# Patient Record
Sex: Male | Born: 1990 | Race: Black or African American | Hispanic: No | Marital: Single | Smoking: Never smoker
Health system: Southern US, Community
[De-identification: ages and names within clinical notes are randomized; demographics above are authoritative.]

## PROBLEM LIST (undated history)

## (undated) DIAGNOSIS — R569 Unspecified convulsions: Secondary | ICD-10-CM

## (undated) DIAGNOSIS — Q909 Down syndrome, unspecified: Secondary | ICD-10-CM

## (undated) HISTORY — PX: BACK SURGERY: SHX140

## (undated) HISTORY — PX: REPAIR OF LEFT VENTRICLE LACERATION: SHX6555

## (undated) HISTORY — DX: Unspecified convulsions: R56.9

---
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
## 2012-11-30 DIAGNOSIS — E669 Obesity, unspecified: Secondary | ICD-10-CM | POA: Diagnosis not present

## 2012-11-30 DIAGNOSIS — R7989 Other specified abnormal findings of blood chemistry: Secondary | ICD-10-CM | POA: Diagnosis not present

## 2012-11-30 DIAGNOSIS — Q909 Down syndrome, unspecified: Secondary | ICD-10-CM | POA: Diagnosis not present

## 2013-12-11 DIAGNOSIS — A09 Infectious gastroenteritis and colitis, unspecified: Secondary | ICD-10-CM | POA: Diagnosis not present

## 2013-12-11 DIAGNOSIS — H6122 Impacted cerumen, left ear: Secondary | ICD-10-CM | POA: Diagnosis not present

## 2014-09-13 DIAGNOSIS — H6122 Impacted cerumen, left ear: Secondary | ICD-10-CM | POA: Diagnosis not present

## 2015-01-05 ENCOUNTER — Emergency Department: Admission: EM | Admit: 2015-01-05 | Payer: Self-pay | Source: Home / Self Care

## 2015-01-05 ENCOUNTER — Encounter: Payer: Self-pay | Admitting: Emergency Medicine

## 2015-01-05 ENCOUNTER — Emergency Department
Admission: EM | Admit: 2015-01-05 | Discharge: 2015-01-05 | Disposition: A | Discharge status: Home / Self Care | Payer: Medicare Other | Attending: Emergency Medicine | Admitting: Emergency Medicine

## 2015-01-05 ENCOUNTER — Emergency Department: Payer: Medicare Other

## 2015-01-05 DIAGNOSIS — R001 Bradycardia, unspecified: Secondary | ICD-10-CM | POA: Diagnosis not present

## 2015-01-05 DIAGNOSIS — Y998 Other external cause status: Secondary | ICD-10-CM | POA: Insufficient documentation

## 2015-01-05 DIAGNOSIS — Y92002 Bathroom of unspecified non-institutional (private) residence single-family (private) house as the place of occurrence of the external cause: Secondary | ICD-10-CM | POA: Insufficient documentation

## 2015-01-05 DIAGNOSIS — W1839XA Other fall on same level, initial encounter: Secondary | ICD-10-CM | POA: Insufficient documentation

## 2015-01-05 DIAGNOSIS — R4182 Altered mental status, unspecified: Secondary | ICD-10-CM | POA: Diagnosis not present

## 2015-01-05 DIAGNOSIS — S0990XA Unspecified injury of head, initial encounter: Secondary | ICD-10-CM | POA: Insufficient documentation

## 2015-01-05 DIAGNOSIS — R569 Unspecified convulsions: Secondary | ICD-10-CM | POA: Insufficient documentation

## 2015-01-05 DIAGNOSIS — Y9389 Activity, other specified: Secondary | ICD-10-CM | POA: Diagnosis not present

## 2015-01-05 DIAGNOSIS — Z008 Encounter for other general examination: Secondary | ICD-10-CM | POA: Diagnosis present

## 2015-01-05 HISTORY — DX: Down syndrome, unspecified: Q90.9

## 2015-01-05 LAB — CBC WITH DIFFERENTIAL/PLATELET
Basophils Absolute: 0 10*3/uL (ref 0–0.1)
Basophils Relative: 1 %
EOS ABS: 0.1 10*3/uL (ref 0–0.7)
EOS PCT: 2 %
HEMATOCRIT: 52.4 % — AB (ref 40.0–52.0)
HEMOGLOBIN: 17.7 g/dL (ref 13.0–18.0)
LYMPHS ABS: 0.9 10*3/uL — AB (ref 1.0–3.6)
Lymphocytes Relative: 26 %
MCH: 34 pg (ref 26.0–34.0)
MCHC: 33.7 g/dL (ref 32.0–36.0)
MCV: 100.7 fL — ABNORMAL HIGH (ref 80.0–100.0)
MONOS PCT: 11 %
Monocytes Absolute: 0.4 10*3/uL (ref 0.2–1.0)
Neutro Abs: 2.1 10*3/uL (ref 1.4–6.5)
Neutrophils Relative %: 60 %
Platelets: 144 10*3/uL — ABNORMAL LOW (ref 150–440)
RBC: 5.2 MIL/uL (ref 4.40–5.90)
RDW: 14.3 % (ref 11.5–14.5)
WBC: 3.5 10*3/uL — AB (ref 3.8–10.6)

## 2015-01-05 LAB — BASIC METABOLIC PANEL
Anion gap: 6 (ref 5–15)
BUN: 9 mg/dL (ref 6–20)
CHLORIDE: 102 mmol/L (ref 101–111)
CO2: 29 mmol/L (ref 22–32)
CREATININE: 1.19 mg/dL (ref 0.61–1.24)
Calcium: 9 mg/dL (ref 8.9–10.3)
GFR calc Af Amer: >60 mL/min (ref >60)
GFR calc non Af Amer: >60 mL/min (ref >60)
Glucose, Bld: 89 mg/dL (ref 65–99)
Potassium: 4.7 mmol/L (ref 3.5–5.1)
SODIUM: 137 mmol/L (ref 135–145)

## 2015-01-05 MED ORDER — LEVETIRACETAM 750 MG PO TABS
750.0000 mg | ORAL_TABLET | Freq: Once | ORAL | Status: AC
  Administered 2015-01-05: 750 mg via ORAL
  Filled 2015-01-05: qty 1

## 2015-01-05 MED ORDER — LEVETIRACETAM 750 MG PO TABS: 750.0000 mg | ORAL_TABLET | Freq: Two times a day (BID) | ORAL | Status: DC

## 2015-01-05 NOTE — ED Provider Notes (Signed)
[facility] Emergency Department Provider Note   ____________________________________________  Time seen: 11:25 AM I have reviewed the triage vital signs and the triage nursing note.  HISTORY  Chief Complaint Medical Clearance   Historian Patient's mom who is a nurse  HPI Jeffrey Jones is a 24 y.o. male with a history of Down syndrome who is cared for by his mom, was witnessed to have a seizure today. He was standing in front of her in the bathroom and developed a blank stare and then stared off to the side and slumped to the ground and started shaking both arms and legs. Entire altered mental status lasted about 2 minutes and extinguished on its own.Mother states that the patient's father had described a somewhat similar episode twice over the past approximately 10 months to her, but now that she is seen as she does not feel like it was simple syncope, but is actually a seizure. There is no tongue biting or urinary incontinence.    Past Medical History  Diagnosis Date  . Down's syndrome     There are no active problems to display for this patient.   Past Surgical History  Procedure Laterality Date  . Back surgery     open-heart surgery as an infant/child Some sort of brain ventricular surgery as a child, no problems since then c1/C2 fusion.  Current Outpatient Rx  Name  Route  Sig  Dispense  Refill  . levETIRAcetam (KEPPRA) 750 MG tablet   Oral   Take 1 tablet (750 mg total) by mouth 2 (two) times daily.   60 tablet   0    none  Allergies Review of patient's allergies indicates no known allergies.  History reviewed. No pertinent family history.  Social History Social History  Substance Use Topics  . Smoking status: Never Smoker   . Smokeless tobacco: None  . Alcohol Use: No    Review of Systems  Constitutional: Negative for fever. Eyes: Negative for visual changes. ENT: Negative for sore throat. Cardiovascular: Negative for  chest pain. Respiratory: Negative for shortness of breath. Gastrointestinal: Negative for abdominal pain, vomiting and diarrhea. Genitourinary: Negative for dysuria. Musculoskeletal: Negative for back pain. Skin: Negative for rash. Neurological: Mom states patient complained of a headache immediately after event. 10 point Review of Systems otherwise negative ____________________________________________   PHYSICAL EXAM:  VITAL SIGNS: ED Triage Vitals  Enc Vitals Group     BP 01/05/15 1057 132/74 mmHg     Pulse Rate 01/05/15 1057 74     Resp 01/05/15 1057 18     Temp 01/05/15 1057 98 F (36.7 C)     Temp Source 01/05/15 1057 Oral     SpO2 01/05/15 1057 100 %     Weight --      Height --      Head Cir --      Peak Flow --      Pain Score 01/05/15 1057 0     Pain Loc --      Pain Edu? --      Excl. in GC? --      Constitutional: Alert and cooperative. Well appearing and in no distress. Eyes: Right lateral subconjunctival hemorrhage. PERRL. Normal extraocular movements. ENT   Head: Normocephalic and atraumatic.   Nose: No congestion/rhinnorhea.   Mouth/Throat: Mucous membranes are moist. Macroglossia. No tongue injury.   Neck: No stridor. Cardiovascular/Chest: Bradycardic, regular..  No murmurs, rubs, or gallops. Respiratory: Normal respiratory effort without tachypnea nor retractions.  Breath sounds are clear and equal bilaterally. No wheezes/rales/rhonchi. Gastrointestinal: Soft. No distention, no guarding, no rebound. Nontender   Genitourinary/rectal:Deferred Musculoskeletal: Nontender with normal range of motion in all extremities. No joint effusions.  No lower extremity tenderness.  No edema. Neurologic:  No gross or focal neurologic deficits are appreciated. Skin:  Skin is warm, dry and intact. No rash noted.   ____________________________________________   EKG I, Governor Rooks, MD, the attending physician have personally viewed and interpreted all  ECGs.  No EKG performed ____________________________________________  LABS (pertinent positives/negatives)  Basic metabolic panel within normal limits White blood cell count 3.5, hemoglobin 17.7 and platelet count 144  ____________________________________________  RADIOLOGY All Xrays were viewed by me. Imaging interpreted by Radiologist.  CT head noncontrast: No acute intracranial abnormalities. __________________________________________  PROCEDURES  Procedure(s) performed: None  Critical Care performed: None  ____________________________________________   ED COURSE / ASSESSMENT AND PLAN  CONSULTATIONS: Phone consultation with neurologist  Pertinent labs & imaging results that were available during my care of the patient were reviewed by me and considered in my medical decision making (see chart for details).  Although initially the mom's description of the symptoms sounded like it could be syncope versus seizure, mom is a nurse and feels like this was definitely seizure activity. He has not been evaluated for this, and as he has had a ventricular brain procedure in the remote past, I discussed CT scan for evaluation today, and we chose to proceed. As this may actually be a third seizure, I discussed the case with the neurologist on-call Dr.Zeylikman, who recommended starting Keppra 750 mg twice daily.  Patient / Family / Caregiver informed of clinical course, medical decision-making process, and agree with plan.   I discussed return precautions, follow-up instructions, and discharged instructions with patient and/or family.  ___________________________________________   FINAL CLINICAL IMPRESSION(S) / ED DIAGNOSES   Final diagnoses:  Seizure (HCC)       Governor Rooks, MD 01/05/15 1353

## 2015-01-05 NOTE — Discharge Instructions (Signed)
Patient was evaluated for seizure, and started on antiseizure medication called Keppra. Return to the emergency room for any worsening condition including seizure that lasts longer than 5 or 10 minutes, or any trouble breathing. Return for any confusion or altered mental status, fever, weakness, numbness, or any other symptoms concerning to you.  We discussed your primary care physician may order additional studies such as MRI of the brain and EEG. You're also recommended follow-up with a neurologist, call on Monday to make appointment for next available   Seizure, Adult A seizure is abnormal electrical activity in the brain. Seizures usually last from 30 seconds to 2 minutes. There are various types of seizures. Before a seizure, you may have a warning sensation (aura) that a seizure is about to occur. An aura may include the following symptoms:   Fear or anxiety.  Nausea.  Feeling like the room is spinning (vertigo).  Vision changes, such as seeing flashing lights or spots. Common symptoms during a seizure include:  A change in attention or behavior (altered mental status).  Convulsions with rhythmic jerking movements.  Drooling.  Rapid eye movements.  Grunting.  Loss of bladder and bowel control.  Bitter taste in the mouth.  Tongue biting. After a seizure, you may feel confused and sleepy. You may also have an injury resulting from convulsions during the seizure. HOME CARE INSTRUCTIONS   If you are given medicines, take them exactly as prescribed by your health care provider.  Keep all follow-up appointments as directed by your health care provider.  Do not swim or drive or engage in risky activity during which a seizure could cause further injury to you or others until your health care provider says it is OK.  Get adequate rest.  Teach friends and family what to do if you have a seizure. They should:  Lay you on the ground to prevent a fall.  Put a cushion under  your head.  Loosen any tight clothing around your neck.  Turn you on your side. If vomiting occurs, this helps keep your airway clear.  Stay with you until you recover.  Know whether or not you need emergency care. SEEK IMMEDIATE MEDICAL CARE IF:  The seizure lasts longer than 5 minutes.  The seizure is severe or you do not wake up immediately after the seizure.  You have an altered mental status after the seizure.  You are having more frequent or worsening seizures. Someone should drive you to the emergency department or call local emergency services (911 in [place] MAKE SURE YOU:  Understand these instructions.  Will watch your condition.  Will get help right away if you are not doing well or get worse.   This information is not intended to replace advice given to you by your health care provider. Make sure you discuss any questions you have with your health care provider.   Document Released: 02/13/2000 Document Revised: 03/08/2014 Document Reviewed: 09/27/2012 Elsevier Interactive Patient Education [employer]2016 Elsevier Inc.

## 2015-01-05 NOTE — ED Notes (Addendum)
24 yom PMHx Down's syndrome presents from home via EMS. Per EMS, mom called 911 after pt had a 'blank stare' lasting approx 45 seconds with slight tremor of the arms. Pt then fell back, lowering to the ground before behavior returning to normal. Previous hx of same ~ 1 year ago but was not worked up for seizure activity. Now pt at baseline. Glucose 93.

## 2015-01-06 ENCOUNTER — Other Ambulatory Visit: Payer: Self-pay

## 2015-01-06 ENCOUNTER — Other Ambulatory Visit: Payer: Self-pay | Admitting: Internal Medicine

## 2015-01-06 DIAGNOSIS — R569 Unspecified convulsions: Secondary | ICD-10-CM

## 2015-01-08 ENCOUNTER — Ambulatory Visit
Admission: RE | Admit: 2015-01-08 | Discharge: 2015-01-08 | Disposition: A | Discharge status: Home / Self Care | Payer: Medicare Other | Source: Ambulatory Visit | Attending: Internal Medicine | Admitting: Internal Medicine

## 2015-01-08 DIAGNOSIS — R569 Unspecified convulsions: Secondary | ICD-10-CM | POA: Diagnosis not present

## 2015-01-16 DIAGNOSIS — R404 Transient alteration of awareness: Secondary | ICD-10-CM | POA: Diagnosis not present

## 2015-01-16 DIAGNOSIS — R4689 Other symptoms and signs involving appearance and behavior: Secondary | ICD-10-CM | POA: Diagnosis not present

## 2015-01-16 DIAGNOSIS — Q909 Down syndrome, unspecified: Secondary | ICD-10-CM | POA: Diagnosis not present

## 2015-01-20 ENCOUNTER — Encounter: Payer: Self-pay | Admitting: Neurology

## 2015-01-20 ENCOUNTER — Ambulatory Visit (INDEPENDENT_AMBULATORY_CARE_PROVIDER_SITE_OTHER): Payer: Medicare Other | Admitting: Neurology

## 2015-01-20 VITALS — BP 98/46 | HR 67 | Ht 63.0 in | Wt 180.5 lb

## 2015-01-20 DIAGNOSIS — R569 Unspecified convulsions: Secondary | ICD-10-CM | POA: Insufficient documentation

## 2015-01-20 DIAGNOSIS — R55 Syncope and collapse: Secondary | ICD-10-CM | POA: Diagnosis not present

## 2015-01-20 NOTE — Progress Notes (Signed)
Reason for visit: Possible seizure  Referring physician: Dr. Synetta Jeffrey Jones is a 24 y.o. male  History of present illness:  Jeffrey Jones is a 24 year old right-handed black male with a history of Down's syndrome with significant cognitive issues. The patient has had 3 blackout events that began in December 2015, another occurred in July 2016, and a recent event occurred on 01/05/2015. The patient had similar events with all 3 episodes. The last one that was witnessed by his mother was associated with a blank stare, the patient then slid to the ground up against the wall, he felt diaphoretic, he had a few myoclonic jerks, and was unconscious for about 2 minutes. There was no tongue biting, or loss of control of the bowels or the bladder. The mother felt the pulse, the pulse was quite weak until after he seemed to recover consciousness when the pulse became strong again. The patient does have a history of a ventricular repair procedure as an infant. There have been no episodes of cardiac rhythm abnormalities in the past. The mother indicates that over the last year or so he has had some cognitive decline that has been slowly progressive. The patient has undergone a CT scan of the head and MRI evaluation of the brain that was unremarkable. An EEG study was done, the results of the study are not available to me. This was done at the [facility]. The patient is sent to this office for further evaluation.  Past Medical History  Diagnosis Date  . Down's syndrome   . Seizures [facility])     Past Surgical History  Procedure Laterality Date  . Back surgery    . Repair of left ventricle laceration      Family History  Problem Relation Age of Onset  . Diabetes Father   . Bell's palsy Father   . Seizures Neg Hx     Social history:  reports that he has never smoked. He has never used smokeless tobacco. He reports that he does not drink alcohol or use illicit drugs.  Medications:  Prior  to Admission medications   Medication Sig Start Date End Date Taking? Authorizing Lerin Jech  levETIRAcetam (KEPPRA) 750 MG tablet Take 1 tablet (750 mg total) by mouth 2 (two) times daily. 01/05/15  Yes Governor Rooks, MD      Allergies  Allergen Reactions  . Amoxicillin     ROS:  Out of a complete 14 system review of symptoms, the patient complains only of the following symptoms, and all other reviewed systems are negative.  Hearing loss Snoring, sleep apnea Confusion, passing out  Blood pressure 98/46, pulse 67, height  (1.6 m), weight 180 lb 8 oz (81.874 kg).  Physical Exam  General: The patient is alert and cooperative at the time of the examination.  Eyes: Pupils are equal, round, and reactive to light. Discs are flat bilaterally.  Neck: The neck is supple, no carotid bruits are noted.  Respiratory: The respiratory examination is clear.  Cardiovascular: The cardiovascular examination reveals a regular rate and rhythm, no obvious murmurs or rubs are noted.  Skin: Extremities are without significant edema.  Neurologic Exam  Mental status: The patient is alert and oriented x 3 at the time of the examination. The patient has apparent normal recent and remote memory, with an apparently normal attention span and concentration ability.  Cranial nerves: Facial symmetry is present. There is good sensation of the face to pinprick and soft touch bilaterally.  The strength of the facial muscles and the muscles to head turning and shoulder shrug are normal bilaterally. Speech is limited, slightly dysarthric. Extraocular movements are full. Visual fields are full. The tongue is midline, and the patient has symmetric elevation of the soft palate. No obvious hearing deficits are noted.  Motor: The motor testing reveals 5 over 5 strength of all 4 extremities. Good symmetric motor tone is noted throughout.  Sensory: Sensory testing is intact to pinprick, soft touch and vibration  sensation on all 4 extremities. No evidence of extinction is noted.  Coordination: Cerebellar testing reveals good finger-nose-finger and heel-to-shin bilaterally.  Gait and station: Gait is normal. Tandem gait was not tested. Romberg is negative. No drift is seen.  Reflexes: Deep tendon reflexes are symmetric and normal bilaterally. Toes are downgoing bilaterally.   MRI brain 01/08/15:  IMPRESSION: 1. No acute or focal lesion to explain seizures. Normal MRI of the brain. 2. Metallic artifact from posterior fusion at C1-2.  * MRI scan images were reviewed online. I agree with the written report.    Assessment/Plan:  1. Down syndrome  2. Syncope versus seizure  The episode described by the mother is unclear, this may have been a vasovagal syncopal event, possibly a seizure. The history of Down's syndrome does put him at a slightly higher risk for seizures. The patient also has a history of cardiac disease, with prior cardiac surgery. The patient will be sent for a cardiac monitor study. The mother indicated that the pulse was quite weak at the time of the syncope. The patient was diaphoretic. I will try to get the report of the EEG study done. The patient will follow-up in 4 months. He does not operate a motor vehicle or engage in any work activity.  Marlan Palau. Keith Willis MD 01/20/2015 7:31 PM  [facility] [address] [place], [place] [id number]  Phone [phone] Fax [phone]

## 2015-01-20 NOTE — Patient Instructions (Signed)

## 2015-01-22 ENCOUNTER — Telehealth: Payer: Self-pay

## 2015-01-22 NOTE — Telephone Encounter (Signed)
Results received and placed on Dr. Anne HahnWillis' desk.

## 2015-01-22 NOTE — Telephone Encounter (Signed)
I called the [facility] and spoke to [place]. She stated the report is not completed yet. They will fax when it is complete.

## 2015-01-27 ENCOUNTER — Telehealth: Payer: Self-pay | Admitting: Neurology

## 2015-01-27 NOTE — Telephone Encounter (Signed)
EEG study done on 16 January 2015 was unremarkable in the awake and drowsy states.

## 2015-02-03 DIAGNOSIS — R569 Unspecified convulsions: Secondary | ICD-10-CM | POA: Diagnosis not present

## 2015-02-03 DIAGNOSIS — Z23 Encounter for immunization: Secondary | ICD-10-CM | POA: Diagnosis not present

## 2015-02-14 ENCOUNTER — Encounter: Payer: Self-pay | Admitting: Neurology

## 2015-05-21 ENCOUNTER — Ambulatory Visit: Payer: Self-pay | Admitting: Neurology

## 2015-11-22 DIAGNOSIS — H6091 Unspecified otitis externa, right ear: Secondary | ICD-10-CM | POA: Diagnosis not present

## 2015-11-22 DIAGNOSIS — H9201 Otalgia, right ear: Secondary | ICD-10-CM | POA: Diagnosis not present

## 2016-06-10 ENCOUNTER — Emergency Department (HOSPITAL_COMMUNITY)
Admission: EM | Admit: 2016-06-10 | Discharge: 2016-06-10 | Disposition: A | Discharge status: Home / Self Care | Payer: Medicare Other | Attending: Emergency Medicine | Admitting: Emergency Medicine

## 2016-06-10 ENCOUNTER — Encounter (HOSPITAL_COMMUNITY): Payer: Self-pay | Admitting: Emergency Medicine

## 2016-06-10 ENCOUNTER — Emergency Department (HOSPITAL_COMMUNITY): Payer: Medicare Other

## 2016-06-10 DIAGNOSIS — R0789 Other chest pain: Secondary | ICD-10-CM | POA: Insufficient documentation

## 2016-06-10 DIAGNOSIS — R079 Chest pain, unspecified: Secondary | ICD-10-CM | POA: Diagnosis not present

## 2016-06-10 DIAGNOSIS — R072 Precordial pain: Secondary | ICD-10-CM | POA: Diagnosis present

## 2016-06-10 LAB — CBC
HEMATOCRIT: 50.2 % (ref 39.0–52.0)
HEMOGLOBIN: 16.9 g/dL (ref 13.0–17.0)
MCH: 34 pg (ref 26.0–34.0)
MCHC: 33.7 g/dL (ref 30.0–36.0)
MCV: 101 fL — AB (ref 78.0–100.0)
Platelets: 158 10*3/uL (ref 150–400)
RBC: 4.97 MIL/uL (ref 4.22–5.81)
RDW: 13.7 % (ref 11.5–15.5)
WBC: 2.2 10*3/uL — AB (ref 4.0–10.5)

## 2016-06-10 LAB — BASIC METABOLIC PANEL
ANION GAP: 8 (ref 5–15)
BUN: 12 mg/dL (ref 6–20)
CHLORIDE: 102 mmol/L (ref 101–111)
CO2: 29 mmol/L (ref 22–32)
Calcium: 9 mg/dL (ref 8.9–10.3)
Creatinine, Ser: 1.18 mg/dL (ref 0.61–1.24)
GFR calc Af Amer: >60 mL/min (ref >60)
GFR calc non Af Amer: >60 mL/min (ref >60)
Glucose, Bld: 67 mg/dL (ref 65–99)
POTASSIUM: 4.2 mmol/L (ref 3.5–5.1)
SODIUM: 139 mmol/L (ref 135–145)

## 2016-06-10 LAB — I-STAT TROPONIN, ED
TROPONIN I, POC: 0 ng/mL (ref 0.00–0.08)
Troponin i, poc: 0 ng/mL (ref 0.00–0.08)

## 2016-06-10 MED ORDER — IBUPROFEN 800 MG PO TABS
800.0000 mg | ORAL_TABLET | Freq: Once | ORAL | Status: AC
  Administered 2016-06-10: 800 mg via ORAL
  Filled 2016-06-10: qty 1

## 2016-06-10 MED ORDER — IBUPROFEN 800 MG PO TABS: 800.0000 mg | ORAL_TABLET | Freq: Three times a day (TID) | ORAL | 0 refills | Status: DC

## 2016-06-10 NOTE — Discharge Instructions (Signed)
Take motrin as needed for pain.   See your primary care doctor   Return to ER if you have severe chest pain, shortness of breath, headaches, vomiting, fevers.

## 2016-06-10 NOTE — ED Provider Notes (Signed)
[facility]-EMERGENCY DEPT Provider Note   CSN: [id number] Arrival date & time: 06/10/16  1011  By signing my name below, I, Cynda Acres, attest that this documentation has been prepared under the direction and in the presence of Charlynne Pander, MD. Electronically Signed: Cynda Acres, Scribe. 06/10/16. 12:39 PM.  History   Chief Complaint Chief Complaint  Patient presents with  . Chest Pain  . Headache   LEVEL 5 CAVEAT DUE TO DOWN SYNDROME   HPI Comments: Jeffrey Jones is a 26 y.o. male with a history of down syndrome and seizures, who presents to the Emergency Department complaining of sudden-onset, constant substernal chest pain that began earlier today.  This chest pain is unusual for the patient. Per mother, the patient was on the bus alone this morning when he began complaining of chest pain, EMS was called. Mother reports a syncopal episode one year ago and saw neurology and had nl EEG per mother and had an EKG that was unremarkable. Patient had heart surgery as a child per mother (possible Left ventricular laceration per chart). Patient also complained of a headache. No medications were given. Blood pressure 11/82 and HR 76 while en route. Patient's pain is under control now.   The history is provided by a parent. No language interpreter was used.    Past Medical History:  Diagnosis Date  . Down's syndrome   . Seizures [facility])     Patient Active Problem List   Diagnosis Date Noted  . Seizures (HCC) 01/20/2015    Past Surgical History:  Procedure Laterality Date  . BACK SURGERY    . REPAIR OF LEFT VENTRICLE LACERATION         Home Medications    Prior to Admission medications   Medication Sig Start Date End Date Taking? Authorizing Provider  levETIRAcetam (KEPPRA) 750 MG tablet Take 1 tablet (750 mg total) by mouth 2 (two) times daily. 01/05/15   Governor Rooks, MD    Family History Family History  Problem Relation Age of Onset  . Diabetes Father   . Bell's  palsy Father   . Seizures Neg Hx     Social History Social History  Substance Use Topics  . Smoking status: Never Smoker  . Smokeless tobacco: Never Used  . Alcohol use No     Allergies   Amoxicillin   Review of Systems Review of Systems  Reason unable to perform ROS: Down syndrome.     Physical Exam Updated Vital Signs BP (!) 117/57 (BP Location: Right Arm)   Pulse (!) 58   Temp 98.3 F (36.8 C) (Oral)   Resp 18   SpO2 100%   Physical Exam  Constitutional: He is oriented to person, place, and time. He appears well-developed and well-nourished.  HENT:  Head: Normocephalic and atraumatic.  Eyes: EOM are normal.  Neck: Normal range of motion.  Cardiovascular: Normal rate and regular rhythm.   Pulmonary/Chest: Effort normal and breath sounds normal. No respiratory distress.  Reproducible tenderness of the chest. Previous midline sternotomy scar, no tenderness.   Abdominal: Soft. He exhibits no distension. There is no tenderness.  Musculoskeletal: Normal range of motion.  No pitting edema. No calf tenderness.   Neurological: He is alert and oriented to person, place, and time.  Skin: Skin is warm and dry.  Psychiatric: He has a normal mood and affect.  Nursing note and vitals reviewed.    ED Treatments / Results  DIAGNOSTIC STUDIES: Oxygen Saturation is 100% on RA, normal  by my interpretation.    COORDINATION OF CARE: 12:38 PM Discussed treatment plan with parent at bedside and parent agreed to plan.  Labs (all labs ordered are listed, but only abnormal results are displayed) Labs Reviewed  CBC - Abnormal; Notable for the following:       Result Value   WBC 2.2 (*)    MCV 101.0 (*)    All other components within normal limits  BASIC METABOLIC PANEL  I-STAT TROPOININ, ED  I-STAT TROPOININ, ED    EKG  EKG Interpretation  Date/Time:  Thursday June 10 2016 10:16:27 EDT Ventricular Rate:  62 PR Interval:  422 QRS Duration: 100 QT  Interval:  384 QTC Calculation: 389 R Axis:   63 Text Interpretation:  Sinus rhythm with sinus arrhythmia with 1st degree A-V block Otherwise normal ECG No significant change since last tracing Confirmed by Conception Doebler  MD, Akram Kissick ([id number]) on 06/10/2016 12:33:48 PM       Radiology Dg Chest 2 View  Result Date: 06/10/2016 CLINICAL DATA:  Intermittent chest pain. EXAM: CHEST  2 VIEW COMPARISON:  04/08/2008 FINDINGS: Cardiomegaly, mild. Postoperative mediastinum, with chronic increased density over the anterior heart in the lateral projection. There is no edema, consolidation, effusion, or pneumothorax. IMPRESSION: Mild cardiomegaly.  No evidence of consolidation or edema. Electronically Signed   By: Marnee Spring M.D.   On: 06/10/2016 11:10    Procedures Procedures (including critical care time)  Medications Ordered in ED Medications  ibuprofen (ADVIL,MOTRIN) tablet 800 mg (800 mg Oral Given 06/10/16 1247)     Initial Impression / Assessment and Plan / ED Course  I have reviewed the triage vital signs and the nursing notes.  Pertinent labs & imaging results that were available during my care of the patient were reviewed by me and considered in my medical decision making (see chart for details).     Jeffrey Jones is a 26 y.o. male here with chest pain. Pain is reproducible. No recent travel, not tachycardic. Had heart surgery as a child but no stents in the heart. EKG unremarkable. Delta trop neg. CXR nl. Likely muscle strain. Recommend motrin prn pain.    Final Clinical Impressions(s) / ED Diagnoses   Final diagnoses:  None    New Prescriptions New Prescriptions   No medications on file   I personally performed the services described in this documentation, which was scribed in my presence. The recorded information has been reviewed and is accurate.    Charlynne Pander, MD 06/10/16 1255

## 2016-06-10 NOTE — ED Triage Notes (Signed)
Pt was on scat bus this morning- originally complaining of CP. EMS reports intermittent CP. Pt then complaining of headache. EMS states pt reports no HA and CP off and on throughout their care for him. Pt has down syndrome. No meds given. BP 118/82, HR 76,

## 2016-07-05 DIAGNOSIS — G479 Sleep disorder, unspecified: Secondary | ICD-10-CM | POA: Diagnosis not present

## 2016-07-05 DIAGNOSIS — J302 Other seasonal allergic rhinitis: Secondary | ICD-10-CM | POA: Diagnosis not present

## 2016-08-18 ENCOUNTER — Encounter: Payer: Self-pay | Admitting: Neurology

## 2016-08-18 ENCOUNTER — Ambulatory Visit (INDEPENDENT_AMBULATORY_CARE_PROVIDER_SITE_OTHER): Payer: Medicare Other | Admitting: Neurology

## 2016-08-18 VITALS — BP 110/60 | HR 64 | Ht 63.0 in | Wt 199.0 lb

## 2016-08-18 DIAGNOSIS — G4733 Obstructive sleep apnea (adult) (pediatric): Secondary | ICD-10-CM | POA: Diagnosis not present

## 2016-08-18 NOTE — Progress Notes (Signed)
Reason for visit: Suspected sleep apnea  Jeffrey Jones is an 26 y.o. male  History of present illness:  Jeffrey Jones is a 26 year old right-handed black male with a history of Down's syndrome. The patient returns to the office today, he was last seen in 2016 for a syncope versus seizure event. The patient has not had any further such events. The mother comes in with him today indicating that she has noted over the last several months that he has sleep apnea. She has observed him during sleep with severe snoring, obstruction of the airway and apneic episodes. The patient does have excessive daytime drowsiness, he has had some decline in cognitive functioning. He does report early morning headaches at times. The mother wishes to get a full evaluation for the sleep apnea.   Past Medical History:  Diagnosis Date  . Down's syndrome   . Seizures (HCC)     Past Surgical History:  Procedure Laterality Date  . BACK SURGERY    . REPAIR OF LEFT VENTRICLE LACERATION      Family History  Problem Relation Age of Onset  . Diabetes Father   . Bell's palsy Father   . Seizures Neg Hx     Social history:  reports that he has never smoked. He has never used smokeless tobacco. He reports that he does not drink alcohol or use drugs.    Allergies  Allergen Reactions  . Amoxicillin     Medications:  Prior to Admission medications   Medication Sig Start Date End Date Taking? Authorizing Provider  fexofenadine (ALLEGRA) 180 MG tablet Take 180 mg by mouth daily.   Yes [provider]  ibuprofen (ADVIL,MOTRIN) 200 MG tablet Take 200 mg by mouth every 6 (six) hours as needed.   Yes [provider]  Multiple Vitamin (MULTIVITAMIN) tablet Take 1 tablet by mouth daily.   Yes [provider]    ROS:  Out of a complete 14 system review of symptoms, the patient complains only of the following symptoms, and all other reviewed systems are negative.  Chest pain Sleep apnea,  excessive daytime drowsiness, snoring  Blood pressure 110/60, pulse 64, height 5\' 3"  (1.6 m), weight 199 lb (90.3 kg), SpO2 98 %.  Physical Exam  General: The patient is alert and cooperative at the time of the examination. The patient is minimally verbal.  Skin: No significant peripheral edema is noted.   Neurologic Exam  Mental status: The patient is alert and cooperative at the time of examination. The patient will follow verbal commands, he is minimally verbal.   Cranial nerves: Facial symmetry is present. Speech is normal, no aphasia or dysarthria is noted. Extraocular movements are full. Visual fields are full.  Motor: The patient has good strength in all 4 extremities.  Sensory examination: Soft touch sensation is symmetric on the face, arms, and legs.  Coordination: The patient has good finger-nose-finger and heel-to-shin bilaterally. Some apraxia with the use of the extremities is noted.  Gait and station: The patient has a normal gait. Tandem gait is normal. Romberg is negative. No drift is seen.  Reflexes: Deep tendon reflexes are symmetric.   Assessment/Plan:  1. Down syndrome  2. Observed sleep apnea  The patient will be referred for sleep evaluation for possible sleep apnea. He will follow-up with me on an as-needed basis.  Marlan Palau. Keith Arnoldo Hildreth MD 08/18/2016 3:32 PM  [facility] [address] [place], [place] [id number]  Phone [phone] Fax [phone]

## 2016-08-18 NOTE — Patient Instructions (Signed)
   We will get an evaluation for sleep apnea set up.

## 2016-08-25 ENCOUNTER — Ambulatory Visit (INDEPENDENT_AMBULATORY_CARE_PROVIDER_SITE_OTHER): Payer: Medicare Other | Admitting: Neurology

## 2016-08-25 ENCOUNTER — Encounter: Payer: Self-pay | Admitting: Neurology

## 2016-08-25 VITALS — BP 103/58 | HR 79 | Ht 63.0 in | Wt 199.0 lb

## 2016-08-25 DIAGNOSIS — Q382 Macroglossia: Secondary | ICD-10-CM | POA: Diagnosis not present

## 2016-08-25 DIAGNOSIS — R29898 Other symptoms and signs involving the musculoskeletal system: Secondary | ICD-10-CM | POA: Insufficient documentation

## 2016-08-25 DIAGNOSIS — Q909 Down syndrome, unspecified: Secondary | ICD-10-CM | POA: Insufficient documentation

## 2016-08-25 DIAGNOSIS — Z8639 Personal history of other endocrine, nutritional and metabolic disease: Secondary | ICD-10-CM | POA: Insufficient documentation

## 2016-08-25 DIAGNOSIS — M6289 Other specified disorders of muscle: Secondary | ICD-10-CM | POA: Diagnosis not present

## 2016-08-25 DIAGNOSIS — G4719 Other hypersomnia: Secondary | ICD-10-CM | POA: Diagnosis not present

## 2016-08-25 DIAGNOSIS — R0683 Snoring: Secondary | ICD-10-CM | POA: Diagnosis not present

## 2016-08-25 DIAGNOSIS — Z87898 Personal history of other specified conditions: Secondary | ICD-10-CM | POA: Insufficient documentation

## 2016-08-25 NOTE — Patient Instructions (Signed)

## 2016-08-25 NOTE — Progress Notes (Signed)
SLEEP MEDICINE CLINIC   Provider:  Melvyn Novas, M D  Primary Care Physician:  Renford Dills, MD   Referring Provider: Renford Dills, MD    Chief Complaint  Patient presents with  . Sleep Consult    He is here with his his father, Jeffrey Jones.  Reports snoring, morning headaches and excessive daytime drowsiness.    HPI:  Jeffrey Jones is a 26 y.o. male , seen here as in a referral/ revisit  from Dr. Anne Hahn for a sleep evaluation.   Jeffrey Jones is a 26 year old right-handed African-American gentleman with Down syndrome seen here today accompanied by his father. Jeffrey Jones does not carry a conversation with me, his sleep concerns have been raised because the patient is a loud snorer, has morning headaches and excessive daytime drowsiness. 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 This will be his first evaluation for sleep at age 15. He had 2 cardiac surgeries, had the repair of the left ventricle laceration. He is currently taking ibuprofen multivitamins and Allegra for allergic rhinitis.  Chief complaint according to patient : per father " he is sleepy as soon as he leaves the bed"   Sleep habits are as follows: The patient goes to bed between 8:30 and 9 PM and is usually asleep promptly. He prefers a lateral sleep position. He sleeps with only one pillow. He will have one or 2 bathroom breaks at night but not every night. He seems to dream frequently and actively participate in his dreams expressing this with motor function and yelling - REM BD ?  The patient cannot sleep in supine position without getting short of breath. Sometimes he needs assistance to turn back to the side. He rises in the morning slowly after 7 or sometimes 8 hours of sleep.  Sleep medical history and family sleep history:  There is no known family history of obstructive sleep apnea, there  is no history of sleepwalking in the patient.   Social history: He does not drink coffee, tea and no soda. He attends [employer], 4 days a week in daytime. He works at a [employer] one day a week.    Reason for visit: Suspected sleep apnea  Jeffrey Jones is an 26 y.o. male  History of present illness:  Mr. [name] is a 26 year old right-handed black male with a history of Down's syndrome. The patient returns to the office today, he was last seen in 2016 for a syncope versus seizure event. The patient has not had any further such events. The mother comes in with him today indicating that she has noted over the last several months that he has sleep apnea. She has observed him during sleep with severe snoring, obstruction of the airway and apneic episodes. The patient does have excessive daytime drowsiness, he has had some decline in cognitive functioning. He does report early morning headaches at times. The mother wishes to get a full evaluation for the sleep apnea.   Past Medical History:  Diagnosis Date  . Down's syndrome   . Seizures (HCC)     Review of Systems: Out of a complete 14 system review, the patient complains of only the following symptoms, and all other reviewed systems are negative. Snoring , EDS , not driving. Cognitive and mental development is reduced.   Epworth score 19 , Fatigue severity score  N/a  Social History   Social History  . Marital status: Single    Spouse name: N/A  . Number of children: 0  . Years of education: HS   Occupational History  . N/A    Social History Main Topics  . Smoking status: Never Smoker  . Smokeless tobacco: Never Used  . Alcohol use No  . Drug use: No  . Sexual activity: Not on file   Other Topics Concern  . Not on file   Social History Narrative   Patient occasionally drinks soft drinks.   Patient is right handed but does other tasks with his left hand.           Family History  Problem Relation Age of  Onset  . Diabetes Father   . Bell's palsy Father   . Seizures Neg Hx     Past Medical History:  Diagnosis Date  . Down's syndrome   . Seizures (HCC)     Past Surgical History:  Procedure Laterality Date  . BACK SURGERY    . REPAIR OF LEFT VENTRICLE LACERATION      Current Outpatient Prescriptions  Medication Sig Dispense Refill  . fexofenadine (ALLEGRA) 180 MG tablet Take 180 mg by mouth daily.    Marland Kitchen. ibuprofen (ADVIL,MOTRIN) 200 MG tablet Take 200 mg by mouth every 6 (six) hours as needed.    . Multiple Vitamin (MULTIVITAMIN) tablet Take 1 tablet by mouth daily.     No current facility-administered medications for this visit.     Allergies as of 08/25/2016 - Review Complete 08/25/2016  Allergen Reaction Noted  . Amoxicillin  01/17/2015    Vitals: BP (!) 103/58   Pulse 79   Ht 5\' 3"  (1.6 m)   Wt 199 lb (90.3 kg)   BMI 35.25 kg/m  Last Weight:  Wt Readings from Last 1 Encounters:  08/25/16 199 lb (90.3 kg)   ZOX:WRUEBMI:Body mass index is 35.25 kg/m.     Last Height:   Ht Readings from Last 1 Encounters:  08/25/16 5\' 3"  (1.6 m)    Physical exam:  General: The patient is awake, alert and appears not in acute distress. The patient is well groomed. Head: Normocephalic, atraumatic. Neck is supple. Mallampati  5, macroglossia noted.  neck circumference: 18. Nasal airflow reduced,  Retrognathia is seen.  Cardiovascular:  Regular rate and rhythm , without  murmurs or carotid bruit, and without distended neck veins. Respiratory: Lungs are clear to auscultation. Skin:  Without evidence of edema, or rash Trunk: BMI is 35. The patient's posture is hunched.  Neurologic exam : The patient is awake and alert,  Cranial nerves: Pupils are equal and briskly reactive to light. Funduscopic exam without evidence of pallor or edema. Extraocular movements  in vertical and horizontal planes intact and without nystagmus. Visual fields by finger perimetry are intact. Hearing to finger rub  intact.   Facial sensation intact to fine touch.  Facial motor strength is symmetric and tongue and uvula move midline. Shoulder shrug was symmetrical.   Motor exam: reduced tone, reduced muscle bulk and symmetrically reduced  strength in all extremities.  Sensory:  Not tested.  Gait and station: Patient walks without assistive device . DTR 2 plus.   Assessment:  After physical and neurologic examination, review of laboratory studies,  Personal review of imaging studies, reports of other /same  Imaging studies, results of polysomnography and / or neurophysiology testing and pre-existing records as far as provided in visit., my assessment is  1)  I agree with Dr. Anne Hahn that Elissa Lovett has a high risk of having obstructive sleep apnea based on an lower than average muscle tone, macroglossia, retrognathia and a very high-grade Mallampati. His neck circumference is 18 inches and also pauses and additional risk as does the elevated body mass index. Most of these risk factors are not modifiable and are related to the patient's Down syndrome. I will order an attended sleep study split night polysomnography. We will take a special precaution and allowing apparent to sleep in the sleep lab by the patient is evaluated. I think this is essential to make him comfortable.     The patient was advised of the nature of the diagnosed disorder , the treatment options and the  risks for general health and wellness arising from not treating the condition.   I spent more than 45 minutes of face to face time with the patient.  Greater than 50% of time was spent in counseling and coordination of care. We have discussed the diagnosis and differential and I answered the patient's questions.    Plan:  Treatment plan and additional workup : Split night PSG - please order capnography and watch out for hypoxia.  No Follow-up on file. Dr Anne Hahn will follow with general neurology. I will continue the sleep care for  this pleasant patient.    Melvyn Novas, MD 08/25/2016, 12:10 PM  Certified in Neurology by ABPN Certified in Sleep Medicine by [facility] [address] [place], [place] [address]

## 2016-09-05 DIAGNOSIS — G4733 Obstructive sleep apnea (adult) (pediatric): Secondary | ICD-10-CM | POA: Diagnosis not present

## 2017-01-06 DIAGNOSIS — G4733 Obstructive sleep apnea (adult) (pediatric): Secondary | ICD-10-CM | POA: Diagnosis not present

## 2017-03-29 DIAGNOSIS — G4733 Obstructive sleep apnea (adult) (pediatric): Secondary | ICD-10-CM | POA: Diagnosis not present

## 2017-03-29 DIAGNOSIS — Q909 Down syndrome, unspecified: Secondary | ICD-10-CM | POA: Diagnosis not present

## 2017-04-07 DIAGNOSIS — G4733 Obstructive sleep apnea (adult) (pediatric): Secondary | ICD-10-CM | POA: Diagnosis not present

## 2017-05-02 ENCOUNTER — Other Ambulatory Visit (HOSPITAL_BASED_OUTPATIENT_CLINIC_OR_DEPARTMENT_OTHER): Payer: Self-pay

## 2017-05-02 DIAGNOSIS — G4730 Sleep apnea, unspecified: Secondary | ICD-10-CM

## 2017-05-02 DIAGNOSIS — R0683 Snoring: Secondary | ICD-10-CM

## 2017-05-02 DIAGNOSIS — G4710 Hypersomnia, unspecified: Secondary | ICD-10-CM

## 2017-05-20 ENCOUNTER — Ambulatory Visit (HOSPITAL_BASED_OUTPATIENT_CLINIC_OR_DEPARTMENT_OTHER): Payer: Medicare Other | Attending: Internal Medicine | Admitting: Internal Medicine

## 2017-05-20 VITALS — Ht 63.0 in | Wt 205.0 lb

## 2017-05-20 DIAGNOSIS — G4731 Primary central sleep apnea: Secondary | ICD-10-CM | POA: Diagnosis not present

## 2017-05-20 DIAGNOSIS — G4710 Hypersomnia, unspecified: Secondary | ICD-10-CM

## 2017-05-20 DIAGNOSIS — G4733 Obstructive sleep apnea (adult) (pediatric): Secondary | ICD-10-CM | POA: Diagnosis not present

## 2017-05-20 DIAGNOSIS — R0683 Snoring: Secondary | ICD-10-CM

## 2017-05-20 DIAGNOSIS — G4730 Sleep apnea, unspecified: Secondary | ICD-10-CM

## 2017-05-28 DIAGNOSIS — G4733 Obstructive sleep apnea (adult) (pediatric): Secondary | ICD-10-CM | POA: Diagnosis not present

## 2017-05-28 NOTE — Procedures (Signed)
   NAME: Jeffrey Jones DATE OF BIRTH:  [birth year] MEDICAL RECORD NUMBER [id number]  LOCATION: [facility]  PHYSICIAN: Deretha EmoryJames C Obe Ahlers  DATE OF STUDY: 05/20/2017  SLEEP STUDY TYPE: Nocturnal Polysomnogram               REFERRING PHYSICIAN: Deretha Emorysborne, Darrall Strey C, MD  INDICATION FOR STUDY: h/o OSA, unable to use CPAP. Excessive sleepiness and would like to try CPAP again  EPWORTH SLEEPINESS SCORE:  16 HEIGHT: 5\' 3"  (160 cm)  WEIGHT: 205 lb (93 kg)    Body mass index is 36.31 kg/m.  NECK SIZE: 17 in.  MEDICATIONS  Patient self administered medications include: N/A. Medications administered during study include No sleep medicine administered.Marland Kitchen.   SLEEP STUDY TECHNIQUE  A multi-channel overnight Polysomnography study was performed. The channels recorded and monitored were central and occipital EEG, electrooculogram (EOG), submentalis EMG (chin), nasal and oral airflow, thoracic and abdominal wall motion, anterior tibialis EMG, snore microphone, electrocardiogram, and a pulse oximetry.   TECHNICAL COMMENTS  Comments added by Technician: Patient was restless all through the night. Patient had difficulty initiating sleep. Comments added by Scorer: N/A   SLEEP ARCHITECTURE  The study was initiated at 10:05:31 PM and terminated at 4:47:53 AM. The total recorded time was 402.4 minutes. EEG confirmed total sleep time was 280.0 minutes yielding a sleep efficiency of 69.6%%. Sleep onset after lights out was 6.8 minutes with a REM latency of 166.5 minutes. The patient spent 10.5%% of the night in stage N1 sleep, 76.1%% in stage N2 sleep, 0.0%% in stage N3 and 13.39% in REM. Wake after sleep onset (WASO) was 115.5 minutes. The Arousal Index was 28.7/hour.   RESPIRATORY PARAMETERS  There were a total of 142 respiratory disturbances out of which 110 were apneas ( 58 obstructive, 15 mixed, 37 central) and 32 hypopneas. The apnea/hypopnea index (AHI) was 30.4 events/hour. The central  sleep apnea index was 7.9 events/hour. The REM AHI was 75.2 events/hour and NREM AHI was 23.5 events/hour. The supine AHI was 49.3 events/hour and the non supine AHI was 1.10 supine during 60.89% of sleep. RDI was 35/hr.Respiratory disturbances were associated with oxygen desaturation down to a nadir of 70.0% during sleep. The mean oxygen saturation during the study was 93.1%. The cumulative time under 88% oxygen saturation was 5.5 minutes.  LEG MOVEMENT DATA  The total leg movements were 1 with a resulting leg movement index of 0.2/hr . Associated arousal with leg movement index was 0.2/hr.   CARDIAC DATA  The underlying cardiac rhythm was most consistent with sinus rhythm. Mean heart rate during sleep was 62.9 bpm. Additional rhythm abnormalities include None.   IMPRESSIONS  - Severe Obstructive Sleep apnea(OSA) - Severe Oxygen Desaturation - Mild Central Sleep Apnea (CSA) was also seen - No significant periodic leg movements(PLMs) during sleep.   DIAGNOSIS  - Obstructive Sleep Apnea (327.23 [G47.33 ICD-10])  RECOMMENDATIONS  - Resumption of auto adjusting CPAP which he tolerated but did not use adequately before.  Deretha EmoryJames C [name] Sleep specialist, American Board of Internal Medicine  ELECTRONICALLY SIGNED ON:  05/28/2017, 9:07 PM  [facility] PH: [phone]   FX: [phone]) [phone] ACCREDITED BY THE AMERICAN ACADEMY OF SLEEP MEDICINE

## 2017-08-05 DIAGNOSIS — D72819 Decreased white blood cell count, unspecified: Secondary | ICD-10-CM | POA: Diagnosis not present

## 2017-08-05 DIAGNOSIS — Q909 Down syndrome, unspecified: Secondary | ICD-10-CM | POA: Diagnosis not present

## 2017-08-05 DIAGNOSIS — Z0000 Encounter for general adult medical examination without abnormal findings: Secondary | ICD-10-CM | POA: Diagnosis not present

## 2017-08-05 DIAGNOSIS — Z1389 Encounter for screening for other disorder: Secondary | ICD-10-CM | POA: Diagnosis not present

## 2017-08-05 DIAGNOSIS — D7589 Other specified diseases of blood and blood-forming organs: Secondary | ICD-10-CM | POA: Diagnosis not present

## 2017-08-05 DIAGNOSIS — Z6836 Body mass index (BMI) 36.0-36.9, adult: Secondary | ICD-10-CM | POA: Diagnosis not present

## 2017-08-05 DIAGNOSIS — E663 Overweight: Secondary | ICD-10-CM | POA: Diagnosis not present

## 2017-08-05 DIAGNOSIS — G4733 Obstructive sleep apnea (adult) (pediatric): Secondary | ICD-10-CM | POA: Diagnosis not present

## 2017-08-05 DIAGNOSIS — Z136 Encounter for screening for cardiovascular disorders: Secondary | ICD-10-CM | POA: Diagnosis not present

## 2017-09-13 DIAGNOSIS — G4733 Obstructive sleep apnea (adult) (pediatric): Secondary | ICD-10-CM | POA: Diagnosis not present

## 2018-07-28 DIAGNOSIS — Z20828 Contact with and (suspected) exposure to other viral communicable diseases: Secondary | ICD-10-CM | POA: Diagnosis not present

## 2018-09-08 DIAGNOSIS — E663 Overweight: Secondary | ICD-10-CM | POA: Diagnosis not present

## 2018-09-08 DIAGNOSIS — Q909 Down syndrome, unspecified: Secondary | ICD-10-CM | POA: Diagnosis not present

## 2018-09-08 DIAGNOSIS — Z0000 Encounter for general adult medical examination without abnormal findings: Secondary | ICD-10-CM | POA: Diagnosis not present

## 2018-09-08 DIAGNOSIS — J302 Other seasonal allergic rhinitis: Secondary | ICD-10-CM | POA: Diagnosis not present

## 2018-09-08 DIAGNOSIS — H60501 Unspecified acute noninfective otitis externa, right ear: Secondary | ICD-10-CM | POA: Diagnosis not present

## 2018-09-21 DIAGNOSIS — G4733 Obstructive sleep apnea (adult) (pediatric): Secondary | ICD-10-CM | POA: Diagnosis not present

## 2019-09-11 DIAGNOSIS — Q909 Down syndrome, unspecified: Secondary | ICD-10-CM | POA: Diagnosis not present

## 2019-09-11 DIAGNOSIS — G4733 Obstructive sleep apnea (adult) (pediatric): Secondary | ICD-10-CM | POA: Diagnosis not present

## 2019-09-11 DIAGNOSIS — Z0000 Encounter for general adult medical examination without abnormal findings: Secondary | ICD-10-CM | POA: Diagnosis not present

## 2019-09-11 DIAGNOSIS — Z1389 Encounter for screening for other disorder: Secondary | ICD-10-CM | POA: Diagnosis not present

## 2020-03-08 DIAGNOSIS — Z1152 Encounter for screening for COVID-19: Secondary | ICD-10-CM | POA: Diagnosis not present

## 2020-04-15 DIAGNOSIS — G4733 Obstructive sleep apnea (adult) (pediatric): Secondary | ICD-10-CM | POA: Diagnosis not present

## 2020-09-12 DIAGNOSIS — Q909 Down syndrome, unspecified: Secondary | ICD-10-CM | POA: Diagnosis not present

## 2020-09-12 DIAGNOSIS — Z0000 Encounter for general adult medical examination without abnormal findings: Secondary | ICD-10-CM | POA: Diagnosis not present

## 2020-09-12 DIAGNOSIS — B353 Tinea pedis: Secondary | ICD-10-CM | POA: Diagnosis not present

## 2020-09-12 DIAGNOSIS — Z1389 Encounter for screening for other disorder: Secondary | ICD-10-CM | POA: Diagnosis not present

## 2020-09-12 DIAGNOSIS — H6120 Impacted cerumen, unspecified ear: Secondary | ICD-10-CM | POA: Diagnosis not present

## 2020-09-12 DIAGNOSIS — G4733 Obstructive sleep apnea (adult) (pediatric): Secondary | ICD-10-CM | POA: Diagnosis not present

## 2021-06-22 DIAGNOSIS — G4733 Obstructive sleep apnea (adult) (pediatric): Secondary | ICD-10-CM | POA: Diagnosis not present

## 2021-08-11 ENCOUNTER — Emergency Department: Payer: Medicare Other

## 2021-08-11 ENCOUNTER — Emergency Department
Admission: EM | Admit: 2021-08-11 | Discharge: 2021-08-11 | Disposition: A | Discharge status: Home / Self Care | Payer: Medicare Other | Attending: Emergency Medicine | Admitting: Emergency Medicine

## 2021-08-11 DIAGNOSIS — M25561 Pain in right knee: Secondary | ICD-10-CM | POA: Diagnosis not present

## 2021-08-11 DIAGNOSIS — M5126 Other intervertebral disc displacement, lumbar region: Secondary | ICD-10-CM

## 2021-08-11 DIAGNOSIS — R9431 Abnormal electrocardiogram [ECG] [EKG]: Secondary | ICD-10-CM | POA: Diagnosis not present

## 2021-08-11 DIAGNOSIS — W01198A Fall on same level from slipping, tripping and stumbling with subsequent striking against other object, initial encounter: Secondary | ICD-10-CM | POA: Insufficient documentation

## 2021-08-11 DIAGNOSIS — M79604 Pain in right leg: Secondary | ICD-10-CM | POA: Diagnosis not present

## 2021-08-11 DIAGNOSIS — M545 Low back pain, unspecified: Secondary | ICD-10-CM | POA: Insufficient documentation

## 2021-08-11 DIAGNOSIS — M25551 Pain in right hip: Secondary | ICD-10-CM | POA: Diagnosis not present

## 2021-08-11 DIAGNOSIS — M546 Pain in thoracic spine: Secondary | ICD-10-CM | POA: Insufficient documentation

## 2021-08-11 DIAGNOSIS — S73191A Other sprain of right hip, initial encounter: Secondary | ICD-10-CM

## 2021-08-11 DIAGNOSIS — M2578 Osteophyte, vertebrae: Secondary | ICD-10-CM | POA: Diagnosis not present

## 2021-08-11 DIAGNOSIS — R569 Unspecified convulsions: Secondary | ICD-10-CM | POA: Diagnosis not present

## 2021-08-11 DIAGNOSIS — G4089 Other seizures: Secondary | ICD-10-CM | POA: Diagnosis not present

## 2021-08-11 DIAGNOSIS — Z043 Encounter for examination and observation following other accident: Secondary | ICD-10-CM | POA: Diagnosis not present

## 2021-08-11 DIAGNOSIS — M48061 Spinal stenosis, lumbar region without neurogenic claudication: Secondary | ICD-10-CM | POA: Diagnosis not present

## 2021-08-11 DIAGNOSIS — M47812 Spondylosis without myelopathy or radiculopathy, cervical region: Secondary | ICD-10-CM | POA: Diagnosis not present

## 2021-08-11 DIAGNOSIS — M5127 Other intervertebral disc displacement, lumbosacral region: Secondary | ICD-10-CM | POA: Diagnosis not present

## 2021-08-11 LAB — CBC WITH DIFFERENTIAL/PLATELET
Abs Immature Granulocytes: 0 10*3/uL (ref 0.00–0.07)
Basophils Absolute: 0 10*3/uL (ref 0.0–0.1)
Basophils Relative: 1 %
Eosinophils Absolute: 0 10*3/uL (ref 0.0–0.5)
Eosinophils Relative: 0 %
HCT: 50.8 % (ref 39.0–52.0)
Hemoglobin: 17.1 g/dL — ABNORMAL HIGH (ref 13.0–17.0)
Immature Granulocytes: 0 %
Lymphocytes Relative: 16 %
Lymphs Abs: 0.6 10*3/uL — ABNORMAL LOW (ref 0.7–4.0)
MCH: 32.6 pg (ref 26.0–34.0)
MCHC: 33.7 g/dL (ref 30.0–36.0)
MCV: 96.8 fL (ref 80.0–100.0)
Monocytes Absolute: 0.3 10*3/uL (ref 0.1–1.0)
Monocytes Relative: 7 %
Neutro Abs: 3 10*3/uL (ref 1.7–7.7)
Neutrophils Relative %: 76 %
Platelets: 153 10*3/uL (ref 150–400)
RBC: 5.25 MIL/uL (ref 4.22–5.81)
RDW: 13.4 % (ref 11.5–15.5)
WBC: 4 10*3/uL (ref 4.0–10.5)
nRBC: 0 % (ref 0.0–0.2)

## 2021-08-11 LAB — BASIC METABOLIC PANEL
Anion gap: 10 (ref 5–15)
BUN: 21 mg/dL — ABNORMAL HIGH (ref 6–20)
CO2: 25 mmol/L (ref 22–32)
Calcium: 9 mg/dL (ref 8.9–10.3)
Chloride: 103 mmol/L (ref 98–111)
Creatinine, Ser: 1.38 mg/dL — ABNORMAL HIGH (ref 0.61–1.24)
GFR, Estimated: >60 mL/min (ref >60)
Glucose, Bld: 130 mg/dL — ABNORMAL HIGH (ref 70–99)
Potassium: 4.2 mmol/L (ref 3.5–5.1)
Sodium: 138 mmol/L (ref 135–145)

## 2021-08-11 MED ORDER — LACTATED RINGERS IV BOLUS
1000.0000 mL | Freq: Once | INTRAVENOUS | Status: AC
  Administered 2021-08-11: 1000 mL via INTRAVENOUS

## 2021-08-11 MED ORDER — NAPROXEN 250 MG PO TABS: 250.0000 mg | ORAL_TABLET | Freq: Two times a day (BID) | ORAL | 0 refills | Status: AC

## 2021-08-11 MED ORDER — KETOROLAC TROMETHAMINE 15 MG/ML IJ SOLN
15.0000 mg | Freq: Once | INTRAMUSCULAR | Status: AC
  Administered 2021-08-11: 15 mg via INTRAMUSCULAR
  Filled 2021-08-11: qty 1

## 2021-08-11 MED ORDER — OXYCODONE HCL 5 MG PO TABS
5.0000 mg | ORAL_TABLET | Freq: Once | ORAL | Status: AC
  Administered 2021-08-11: 5 mg via ORAL
  Filled 2021-08-11: qty 1

## 2021-08-11 MED ORDER — ACETAMINOPHEN 500 MG PO TABS
1000.0000 mg | ORAL_TABLET | Freq: Once | ORAL | Status: AC
  Administered 2021-08-11: 1000 mg via ORAL
  Filled 2021-08-11: qty 2

## 2021-08-11 NOTE — ED Provider Notes (Signed)
[facility] Provider Note    Event Date/Time   First MD Initiated Contact with Patient 08/11/21 1422     (approximate)   History   Seizures (Pt from home for witnessed seizure. Pt's mother reports hx of several of these events in the past but no formal diagnosis of seizure or medications. Pt's mother reports this may have happened twice today and believes he may have lost consciousness. Pt had assisted fall and hit leg, reporting R leg pain. Pt at baseline on arrival, EMS reports no post ictal phase. )   HPI  Jeffrey Jones is a 31 y.o. male with past medical history of Down syndrome not currently on any medications who presents accompanied by mother for evaluation with concern for seizure.  She reports he thinks he had 2 episodes today one was unwitnessed but he was complaining of some pain in his right hip and she is where he fell.  Second episode occurred while she was in the room when she was holding him he had generalized shaking that lasted less than a minute and stopped on its own.  She states he has had intermittent episodes like this going back years with a negative EEG evaluation has never formally been diagnosed with a seizure disorder but at one time it was thought that he was possibly syncopized in.  She reports no other recent episodes last couple days or other sick symptoms she is aware of such as fevers, chills, cough, nausea, vomiting or diarrhea, rash or any other concerns at this time.  Per mother at bedside he is at his neurological baseline and other than some pain in the right hip patient has no other complaints although further history is limited from him has a his some developmental delay from his Down syndrome.      Physical Exam  Triage Vital Signs: ED Triage Vitals  Enc Vitals Group     BP 08/11/21 1416 118/70     Pulse Rate 08/11/21 1416 70     Resp 08/11/21 1416 19     Temp 08/11/21 1416 98.2 F (36.8 C)     Temp Source 08/11/21 1416  Oral     SpO2 08/11/21 1416 94 %     Weight 08/11/21 1417 197 lb 11.2 oz (89.7 kg)     Height 08/11/21 1417 5\' 3"  (1.6 m)     Head Circumference --      Peak Flow --      Pain Score 08/11/21 1417 4     Pain Loc --      Pain Edu? --      Excl. in [place]? --     Most recent vital signs: Vitals:   08/11/21 1416  BP: 118/70  Pulse: 70  Resp: 19  Temp: 98.2 F (36.8 C)  SpO2: 94%    General: Awake, no distress.  CV:  Good peripheral perfusion.  2+ radial and PT pulses. Resp:  Normal effort.  Clear bilaterally. Abd:  No distention.  Soft throughout. Other:  Cranial nerves II through XII are grossly intact.  Patient has symmetric strength in upper extremities and lower extremity in the left.  He does seem weak in the right hip and knee although somewhat difficult to tell if this is secondary to pain as he is wincing during range of motion of the right hip and knee.  No evidence of trauma or weakness in the right ankle.  Sensation is intact to light touch all extremities.  No evidence of other gross trauma to the face scalp head neck or torso.  No C-spine tenderness although some tenderness in the T and L-spine.   ED Results / Procedures / Treatments  Labs (all labs ordered are listed, but only abnormal results are displayed) Labs Reviewed  CBC WITH DIFFERENTIAL/PLATELET - Abnormal; Notable for the following components:      Result Value   Hemoglobin 17.1 (*)    Lymphs Abs 0.6 (*)    All other components within normal limits  BASIC METABOLIC PANEL - Abnormal; Notable for the following components:   Glucose, Bld 130 (*)    BUN 21 (*)    Creatinine, Ser 1.38 (*)    All other components within normal limits  CBG MONITORING, ED     EKG  ECG is remarkable sinus rhythm with a ventricular rate of 69, normal axis with first-degree block with a parable of 391 no evidence of WPW and narrow QRS and unremarkable QTc interval.  No evidence of acute ischemia.  This ECG is compared to that  obtained from 2018 showed a PR interval of 422   RADIOLOGY CT head and C-spine interpretation without evidence of a skull fracture, clear C-spine fracture or significant intracranial hemorrhage.  Pending formal radiology read at time of signout.  PROCEDURES:  Critical Care performed: No  .1-3 Lead EKG Interpretation  Performed by: Lucrezia Starch, MD Authorized by: Lucrezia Starch, MD     Interpretation: non-specific     ECG rate assessment: normal     Rhythm: sinus rhythm     Ectopy: none     Conduction: abnormal     Abnormal conduction: 1st degree AV block     The patient is on the cardiac monitor to evaluate for evidence of arrhythmia and/or significant heart rate changes.   MEDICATIONS ORDERED IN ED: Medications  acetaminophen (TYLENOL) tablet 1,000 mg (has no administration in time range)  lactated ringers bolus 1,000 mL (has no administration in time range)     IMPRESSION / MDM / ASSESSMENT AND PLAN / ED COURSE  I reviewed the triage vital signs and the nursing notes. Patient's presentation is most consistent with acute presentation with potential threat to life or bodily function.                               Differential diagnosis includes, but is not limited to seizure versus convulsive syncope.  It seems patient has very similar episodes going back years.  We will check for any electrolyte derangements.  There is no history or exam features suggest acute infectious process.  Given exam patient may have had a fall and is complaining of right hip and knee pain and has some tenderness in the T and L-spine.  I will obtain a CT head C-spine as well as plain films of the T-spine, L-spine, right hip and knee.  PR interval is prolonged and abnormal but seems this is not a new block today and there is no other abnormal intervals or arrhythmia noted.  CBC shows no leukocytosis or acute anemia.  BMP shows creatinine of 1.38 compared to 1.185 years ago without any other  significant derangements.  We will give some IV fluids pending CT imaging.  Care patient signed over to assuming provider approximately 1500.  Plan for follow-up imaging and reassess and likely discharge with close outpatient neurology follow-up.        FINAL CLINICAL  IMPRESSION(S) / ED DIAGNOSES   Final diagnoses:  Seizure-like activity ([place])  Right hip pain  Acute pain of right knee  Acute midline low back pain without sciatica     Rx / DC Orders   ED Discharge Orders     None        Note:  This document was prepared using Dragon voice recognition software and may include unintentional dictation errors.   Lucrezia Starch, MD 08/11/21 [phone]

## 2021-08-11 NOTE — ED Notes (Signed)
Pt transported to MRI 

## 2021-08-11 NOTE — ED Provider Notes (Addendum)
Patient signed out to me pending imaging.  Is a 31 year old male with history of Down syndrome possible seizures not on any medication presenting with seizure-like activity.  He complained of some pain diffusely and was difficult to get history from so CT head and C-spine obtained but also x-rays of the knee pelvis thoracic and lumbar spine obtained which are pending at the time of discharge.  Imaging is negative for acute fracture or dislocation.  On reassessment patient and mom are both saying that he is unable to ambulate.  Patient points to right upper thigh and lateral hip area when asked about location of his pain.  He does have pain with flexion internal/external rotation of the hip.  No obvious deformity, his neurovascular exam is normal.  No lumbar tenderness.  Patient unwilling to get up to ambulate due to pain.  Given this I obtained a CT of the pelvis to rule out occult fracture which again is negative.  He was given oxycodone and Tylenol.  On reassessment still unwilling to ambulate.  Will obtain MRI lumbar spine and MRI of the hip as if patient is not willing to ambulate he is not a safe discharge.  MRI lumbar spine shows right-sided disc protrusion at L3/L4 which could be pain.  MRI of the hip preliminarily shows possible pure acetabular labral tear.  Discussed findings with patient and mom.  He was able to ambulate.  Will discharge with prescription for naproxen orthopedic follow-up. Rada Hay, MD 08/11/21 1718    Rada Hay, MD 08/11/21 2325

## 2021-08-11 NOTE — Discharge Instructions (Addendum)
The MRI of your right hip shows you likely have a tear in one of the ligaments in the hip which is causing the pain.  Take naproxen twice daily for pain and please follow-up with orthopedics.

## 2021-08-11 NOTE — ED Notes (Signed)
Pt given boxed lunch, crackers and water per [facility] from [facility].

## 2021-09-18 DIAGNOSIS — G4733 Obstructive sleep apnea (adult) (pediatric): Secondary | ICD-10-CM | POA: Diagnosis not present

## 2021-09-18 DIAGNOSIS — Q909 Down syndrome, unspecified: Secondary | ICD-10-CM | POA: Diagnosis not present

## 2021-09-18 DIAGNOSIS — R739 Hyperglycemia, unspecified: Secondary | ICD-10-CM | POA: Diagnosis not present

## 2021-09-18 DIAGNOSIS — Z0000 Encounter for general adult medical examination without abnormal findings: Secondary | ICD-10-CM | POA: Diagnosis not present

## 2021-09-18 DIAGNOSIS — M519 Unspecified thoracic, thoracolumbar and lumbosacral intervertebral disc disorder: Secondary | ICD-10-CM | POA: Diagnosis not present

## 2021-09-18 DIAGNOSIS — Z1331 Encounter for screening for depression: Secondary | ICD-10-CM | POA: Diagnosis not present

## 2021-09-18 DIAGNOSIS — R569 Unspecified convulsions: Secondary | ICD-10-CM | POA: Diagnosis not present

## 2021-10-05 ENCOUNTER — Encounter: Payer: Self-pay | Admitting: Neurology

## 2021-10-30 ENCOUNTER — Ambulatory Visit: Payer: Medicare Other | Admitting: Neurology

## 2021-11-25 ENCOUNTER — Ambulatory Visit (INDEPENDENT_AMBULATORY_CARE_PROVIDER_SITE_OTHER): Payer: Medicare Other | Admitting: Neurology

## 2021-11-25 ENCOUNTER — Encounter: Payer: Self-pay | Admitting: Neurology

## 2021-11-25 VITALS — BP 106/72 | HR 77 | Ht 64.0 in | Wt 211.2 lb

## 2021-11-25 DIAGNOSIS — R55 Syncope and collapse: Secondary | ICD-10-CM | POA: Diagnosis not present

## 2021-11-25 NOTE — Progress Notes (Signed)
NEUROLOGY CONSULTATION NOTE  Jeffrey Jones MRN: [id number] DOB: 09-03-1990  Referring provider: Dr. Seward Carol Primary care provider: Dr. Seward Carol  Reason for consult:  seizures  Dear Dr Delfina Redwood:  Thank you for your kind referral of Jeffrey Jones for consultation of the above symptoms. Although his history is well known to you, please allow me to reiterate it for the purpose of our medical record. 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 Records and images were personally reviewed where available.   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 His mother reports that he started having episodes in 2014, she has witnessed 2 of them, each time he would fall to the floor, with slight body quivering "not distinct enough to say seizure." They would frequently tend to happen in the bathroom. One time, she was squeezing a pimple and he went down. He had cardiology and neurology evaluations in the past, notes from neurologist Dr. Jannifer Franklin from 2016 indicate blackout events associated with blank stare, few myoclonic jerks, unconscious for 2 minutes. EEG in 12/2014 was normal. On 08/11/2021, he was brought to the ER after his mother called him a few times and noticed he was limping. He was leaning to one side, skin was diaphoretic, grip on the right hand was weaker. He was crying out in pain. She went to the bathroom and found he was unable to stand. He was sitting then his eyes went back, he fell forward with small jerking movements. She thinks it lasted less than a minute, when she she got him back up he started coming around. No tongue bite or incontinence. He was brought to the ER where CBC and BMP were unremarkable except for creatinine of 1.38. I personally reviewed  head CT without contrast which did not show any acute changes, there was artifact in the posterior fossa from the craniocervical fusion hardware. He had large bilateral middle ear/mastoid effusions. 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 His mother reports another fall/possible syncopal episode yesterday when he was with his father who heard a fall in the bathroom at 29am. When his father got there, he was coming to, no convulsive activity seen.   He is very active, playing football, golf, tennis, cutting grass. He will be part of a musical play and loves to dance. Sleep is good, he was diagnosed with OSA but does not use the CPAP machine. His mother has not seen the apneic episodes she was previously concerned about. Recently there have been times she has to call him a couple of extra times. No myoclonic jerks. He does not complain of any symptoms. He no longer complains of pain in the hip from the fall in June. His mother denies any family history of seizures, no history of febrile seizures, CNS infections, significant traumatic brain injuries.   PAST MEDICAL HISTORY: Past Medical History:  Diagnosis Date   Down's syndrome    Seizures ([place])     PAST SURGICAL HISTORY: Past Surgical History:  Procedure Laterality Date   BACK SURGERY     REPAIR OF LEFT VENTRICLE LACERATION      MEDICATIONS: Current Outpatient Medications on File Prior to Visit  Medication  Sig Dispense Refill   fexofenadine (ALLEGRA) 180 MG tablet Take 180 mg by mouth daily.     ibuprofen (ADVIL,MOTRIN) 200 MG tablet Take 200 mg by mouth every 6 (six) hours as needed.     Multiple Vitamin (MULTIVITAMIN) tablet Take 1 tablet by mouth daily.     No current facility-administered medications on file prior to visit.    ALLERGIES: Allergies  Allergen Reactions   Amoxicillin     FAMILY  HISTORY: Family History  Problem Relation Age of Onset   Diabetes Father    Bell's palsy Father    Seizures Neg Hx     SOCIAL HISTORY: Social History   Socioeconomic History   Marital status: Single    Spouse name: Not on file   Number of children: 0   Years of education: HS   Highest education level: Not on file  Occupational History   Occupation: N/A  Tobacco Use   Smoking status: Never   Smokeless tobacco: Never  Vaping Use   Vaping Use: Never used  Substance and Sexual Activity   Alcohol use: No   Drug use: No   Sexual activity: Not on file  Other Topics Concern   Not on file  Social History Narrative   Patient occasionally drinks soft drinks.   Patient is right handed but does other tasks with his left hand.          Social Determinants of Health   Financial Resource Strain: Not on file  Food Insecurity: Not on file  Transportation Needs: Not on file  Physical Activity: Not on file  Stress: Not on file  Social Connections: Not on file  Intimate Partner Violence: Not on file     PHYSICAL EXAM: Vitals:   11/25/21 1259  BP: 106/72  Pulse: 77  SpO2: 98%   General: No acute distress, Down syndrome facies Head:  Normocephalic/atraumatic Skin/Extremities: No rash, no edema Neurological Exam: Mental status: alert and awake. He is able to say "1" and "2" when counting fingers but otherwise has minimal verbal output, grunting some words out. He is able to follow instructions.  Cranial nerves: CN I: not tested CN II: pupils equal, round and reactive to light, visual fields intact CN III, IV, VI:  full range of motion, no nystagmus, no ptosis CN VII: upper and lower face symmetric CN VIII: hearing intact to conversation Bulk & Tone: normal, no fasciculations. Motor: 5/5 throughout with no pronator drift. Deep Tendon Reflexes: +2 throughout Cerebellar: no incoordination on finger to nose testing Gait: slightly hunched posture, slightly wide-based, no  ataxia Tremor: none   IMPRESSION: This is a pleasant 31 year old man with a history of Down syndrome and recurrent syncopal episodes since around 2014. Prior EEG in 2016 was normal. Head CT no acute changes. There appears to be an increase in falls/syncope, he had 2 in June 2023, then again yesterday while with his father. Etiology unclear, vasovagal syncope/cardiac arrhythmia versus seizure. From a neurological standpoint, we will repeat the EEG. He will be referred to Cardiology for evaluation. Our office will call with EEG results, if normal, follow-up as needed. His mother knows to call for any changes.   Thank you for allowing me to participate in the care of this patient. Please do not hesitate to call for any questions or concerns.   Patrcia Dolly, M.D.  CC: Dr. Nehemiah Settle

## 2021-11-25 NOTE — Patient Instructions (Signed)
Good to meet you.  Schedule EEG  2. Referral will be sent to Cardiology  3. Our office will call with EEG results, if normal, follow-up as needed. Call for any changes

## 2021-12-16 ENCOUNTER — Ambulatory Visit: Payer: Medicare Other | Admitting: Podiatry

## 2021-12-21 ENCOUNTER — Ambulatory Visit: Payer: Medicare Other | Attending: Cardiovascular Disease | Admitting: Cardiovascular Disease

## 2021-12-21 ENCOUNTER — Encounter: Payer: Self-pay | Admitting: Cardiovascular Disease

## 2021-12-21 DIAGNOSIS — R55 Syncope and collapse: Secondary | ICD-10-CM | POA: Diagnosis not present

## 2021-12-21 DIAGNOSIS — G4733 Obstructive sleep apnea (adult) (pediatric): Secondary | ICD-10-CM

## 2021-12-21 NOTE — Assessment & Plan Note (Signed)
Jeffrey Jones was referred to me for work-up of syncope.  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  He has had syncopal episodes for last 7 or 8 years.  He had extensive neurologic work-up without a diagnosis.  He apparently did have a VSD repair at [facility] at age 29 months.  I am going to get a 2D echocardiogram as well as a loop recorder implantation to rule out a structural and arrhythmogenic cause.

## 2021-12-21 NOTE — Patient Instructions (Addendum)
Medication Instructions:  Your physician recommends that you continue on your current medications as directed. Please refer to the Current Medication list given to you today.  *If you need a refill on your cardiac medications before your next appointment, please call your pharmacy*   Testing/Procedures: Your physician has requested that you have an echocardiogram. Echocardiography is a painless test that uses sound waves to create images of your heart. It provides your doctor with information about the size and shape of your heart and how well your heart's chambers and valves are working. This procedure takes approximately one hour. There are no restrictions for this procedure. Please do NOT wear cologne, perfume, aftershave, or lotions (deodorant is allowed). Please arrive 15 minutes prior to your appointment time. This procedure will be done at [address]   Follow-Up: At [facility], you and your health needs are our priority.  As part of our continuing mission to provide you with exceptional heart care, we have created designated Provider Care Teams.  These Care Teams include your primary Cardiologist (physician) and Advanced Practice Providers (APPs -  Physician Assistants and Nurse Practitioners) who all work together to provide you with the care you need, when you need it.  We recommend signing up for the patient portal called "MyChart".  Sign up information is provided on this After Visit Summary.  MyChart is used to connect with patients for Virtual Visits (Telemedicine).  Patients are able to view lab/test results, encounter notes, upcoming appointments, etc.  Non-urgent messages can be sent to your provider as well.   To learn more about what you can do with MyChart, go to [email or web address].    Your next appointment:   6 month(s)  The format for your next appointment:   In Person  Provider:   Nanetta Batty, MD    Other  Instructions     [facility]  [address], [address]  [place], [place] [address]  Phone: [phone] Fax: [phone]     Please arrive at your appointment 15-20 minutes early.   You do not need to be fasting.   The procedure is performed with local anesthesia. You will not receive sedatives nor will an IV be placed.   Wash your chest and neck with the surgical soap the evening before and the morning of your procedure. Please following the washing instructions provided.   As with all surgical implants, there is a small risk of infection. If an infection occurs, the device will be removed. To help reduce the risk, please use the surgical scrub provided. Additional antiseptic precautions will be taken at the time of the procedure.   Please bring your insurance cards.   You will be scheduled for a two week wound check visit with Dr. Royann Shivers. This will be a video visit. If you are unable to do a video visit then you may come into the office.  *Please note that scheduled loop recorder implants may need to be rescheduled if Dr. Royann Shivers has a procedure urgently added on at the hospital   Preparing for the Procedure  Before the procedure, you can play an important role. Because skin is not sterile, your skin needs to be as free of germs as possible. You can reduce the number of germs on your skin by washing with CHG (chlorhexidine gluconate) Soap before the procedure. CHG is an antiseptic cleaner which kills germs and bonds with the skin to continue killing germs even after washing.  Please  do not use if you have an allergy to CHG or antibacterial soaps. If your skin becomes reddened/irritated, STOP using the CHG.  DO NOT SHAVE (including legs and underarms) for at least 48 hours prior to first CHG shower. It is OK to shave your face.  Please follow these instructions carefully: Shower the night before the procedure and the morning of with CHG Soap. If you chose to wash  your hair, wash your hair first as usual with your normal shampoo/conditioner. After you shampoo/condition, rinse you hair and body thoroughly to remove shampoo/conditioner. Use CHG as you would any other liquid soap. You can apply CHG directly to the skin and wash gently with a loofah or a clean washcloth. Apply the CHG Soap to your body ONLY FROM THE NECK DOWN. Do not use on open wounds or open sores. Avoid contact with your eyes, ears, mouth, and genitals (private parts).  Wash thoroughly, paying special attention to the area where your surgery will be performed. Thoroughly rinse your body with warm water from the neck down. DO NOT shower/wash with your normal soap after using and rinsing off the CHG Soap. Pat yourself dry with a clean towel. Wear clean pajamas to bed. Place clean sheets on your bed the night of your first shower and do not sleep with pets..  Day of Surgery: Shower with the CHG Soap following the instructions listed above. DO NOT apply deodorants or lotions.

## 2021-12-21 NOTE — Progress Notes (Signed)
12/21/2021 Jeffrey Jones   1990-10-11  [id number]  Primary Physician Jeffrey Dills, MD Primary Cardiologist: Jeffrey Gess MD Jeffrey Jones, [place], [place]  HPI:  Jeffrey Jones is a 31 y.o. mildly overweight single African-American male accompanied by his mother Jeffrey Jones today.  He has a trisomy 21 patient.  He had a VSD repair at [facility] at age 19 months and C1-2  to surgery at [facility].  He has no cardiac risk factors.  He has speech impediment as well and is difficult to communicate.  He is somewhat active than please Jeffrey Jones sports including basketball football and golf.  He has had episodes of syncope that have been witnessed going back 6 to 8 years without obvious seizure activity.  These episodes occur at least once a year although has had several this year.  He had extensive neurologic work-up without a diagnosis.   Current Meds  Medication Sig   fexofenadine (ALLEGRA) 180 MG tablet Take 180 mg by mouth daily.   ibuprofen (ADVIL,MOTRIN) 200 MG tablet Take 200 mg by mouth every 6 (six) hours as needed.   Multiple Vitamin (MULTIVITAMIN) tablet Take 1 tablet by mouth daily.     Allergies  Allergen Reactions   Amoxicillin     Social History   Socioeconomic History   Marital status: Single    Spouse name: Not on file   Number of children: 0   Years of education: HS   Highest education level: Not on file  Occupational History   Occupation: N/A  Tobacco Use   Smoking status: Never   Smokeless tobacco: Never  Vaping Use   Vaping Use: Never used  Substance and Sexual Activity   Alcohol use: No   Drug use: No   Sexual activity: Not on file  Other Topics Concern   Not on file  Social History Narrative   Patient occasionally drinks soft drinks.   Patient is right handed but does other tasks with his left hand.          Social Determinants of Health   Financial Resource Strain: Not on file  Food Insecurity: Not on file  Transportation  Needs: Not on file  Physical Activity: Not on file  Stress: Not on file  Social Connections: Not on file  Intimate Partner Violence: Not on file     Review of Systems: General: negative for chills, fever, night sweats or weight changes.  Cardiovascular: negative for chest pain, dyspnea on exertion, edema, orthopnea, palpitations, paroxysmal nocturnal dyspnea or shortness of breath Dermatological: negative for rash Respiratory: negative for cough or wheezing Urologic: negative for hematuria Abdominal: negative for nausea, vomiting, diarrhea, bright red blood per rectum, melena, or hematemesis Neurologic: negative for visual changes, syncope, or dizziness All other systems reviewed and are otherwise negative except as noted above.    Blood pressure 122/62, pulse 65, height 5\' 4"  (1.626 m), weight 207 lb (93.9 kg), SpO2 96 %.  General appearance: alert and no distress Neck: no adenopathy, no carotid bruit, no JVD, supple, symmetrical, trachea midline, and thyroid not enlarged, symmetric, no tenderness/mass/nodules Lungs: clear to auscultation bilaterally Heart: regular rate and rhythm, S1, S2 normal, no murmur, click, rub or gallop Extremities: extremities normal, atraumatic, no cyanosis or edema Pulses: 2+ and symmetric Skin: Skin color, texture, turgor normal. No rashes or lesions Neurologic: Grossly normal  EKG sinus rhythm at 65 with long first-degree AV block.  Personally reviewed this EKG.  ASSESSMENT AND PLAN:  Obstructive sleep apnea History of obstructive sleep apnea not on CPAP  Syncope and collapse Mr. Rominger was referred to me for work-up of syncope.  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  He has had syncopal episodes for last 7 or 8 years.  He had extensive neurologic work-up without a diagnosis.  He apparently did have a VSD repair at [facility] at age 32 months.  I am going to get a 2D echocardiogram as well as a loop recorder implantation to rule out a  structural and arrhythmogenic cause.     Lorretta Harp MD FACP,FACC,FAHA, [facility] 12/21/2021 2:33 PM

## 2021-12-21 NOTE — Assessment & Plan Note (Signed)
History of obstructive sleep apnea not on CPAP. 

## 2021-12-22 ENCOUNTER — Ambulatory Visit (INDEPENDENT_AMBULATORY_CARE_PROVIDER_SITE_OTHER): Payer: Medicare Other | Admitting: Podiatry

## 2021-12-22 DIAGNOSIS — Z79899 Other long term (current) drug therapy: Secondary | ICD-10-CM | POA: Diagnosis not present

## 2021-12-22 DIAGNOSIS — B351 Tinea unguium: Secondary | ICD-10-CM

## 2021-12-23 LAB — HEPATIC FUNCTION PANEL
ALT: 11 IU/L (ref 0–44)
AST: 16 IU/L (ref 0–40)
Albumin: 4.1 g/dL — ABNORMAL LOW (ref 4.3–5.2)
Alkaline Phosphatase: 97 IU/L (ref 44–121)
Bilirubin Total: 0.6 mg/dL (ref 0.0–1.2)
Bilirubin, Direct: 0.17 mg/dL (ref 0.00–0.40)
Total Protein: 7.3 g/dL (ref 6.0–8.5)

## 2021-12-23 MED ORDER — TERBINAFINE HCL 250 MG PO TABS: 250.0000 mg | ORAL_TABLET | Freq: Every day | ORAL | 0 refills | Status: DC

## 2021-12-29 NOTE — Progress Notes (Signed)
  Subjective:  Patient ID: Jeffrey Jones, male    DOB: 07/01/1990,  MRN: [id number]  Chief Complaint  Patient presents with   Nail Problem    31 y.o. male presents with the above complaint. Patient presents with thickened elongated dystrophic toenails x10 mild pain on palpation.  He states he would like to discuss treatment options for nail fungus.  He is tried some over-the-counter stuff which has not helped.  He has not seen and was prior to seeing me.  He would like to discuss treatment options for it.  He is here with his caretaker   Review of Systems: Negative except as noted in the HPI. Denies N/V/F/Ch.  Past Medical History:  Diagnosis Date   Down's syndrome    Seizures ([place])     Current Outpatient Medications:    terbinafine (LAMISIL) 250 MG tablet, Take 1 tablet (250 mg total) by mouth daily., Disp: 90 tablet, Rfl: 0   fexofenadine (ALLEGRA) 180 MG tablet, Take 180 mg by mouth daily., Disp: , Rfl:    ibuprofen (ADVIL,MOTRIN) 200 MG tablet, Take 200 mg by mouth every 6 (six) hours as needed., Disp: , Rfl:    Multiple Vitamin (MULTIVITAMIN) tablet, Take 1 tablet by mouth daily., Disp: , Rfl:   Social History   Tobacco Use  Smoking Status Never  Smokeless Tobacco Never    Allergies  Allergen Reactions   Amoxicillin    Objective:  There were no vitals filed for this visit. There is no height or weight on file to calculate BMI. Constitutional Well developed. Well nourished.  Vascular Dorsalis pedis pulses palpable bilaterally. Posterior tibial pulses palpable bilaterally. Capillary refill normal to all digits.  No cyanosis or clubbing noted. Pedal hair growth normal.  Neurologic Normal speech. Oriented to person, place, and time. Epicritic sensation to light touch grossly present bilaterally.  Dermatologic Nails thickened elongated dystrophic mycotic toenails x10 Skin within normal limits  Orthopedic: Normal joint ROM without pain or crepitus bilaterally. No  visible deformities. No bony tenderness.   Radiographs: None Assessment:   1. Long-term use of high-risk medication   2. Nail fungus   3. Onychomycosis due to dermatophyte    Plan:  Patient was evaluated and treated and all questions answered.  Onychomycosis toenails x10 -Educated the patient on the etiology of onychomycosis and various treatment options associated with improving the fungal load.  I explained to the patient that there is 3 treatment options available to treat the onychomycosis including topical, p.o., laser treatment.  Patient elected to undergo p.o. options with Lamisil/terbinafine therapy.  In order for me to start the medication therapy, I explained to the patient the importance of evaluating the liver and obtaining the liver function test.  Once the liver function test comes back normal I will start him on 31-month course of Lamisil therapy.  Patient understood all risk and would like to proceed with Lamisil therapy.  I have asked the patient to immediately stop the Lamisil therapy if she has any reactions to it and call the office or go to the emergency room right away.  Patient states understanding   No follow-ups on file.

## 2022-01-05 ENCOUNTER — Ambulatory Visit (HOSPITAL_COMMUNITY): Payer: Medicare Other | Attending: Cardiovascular Disease

## 2022-01-05 DIAGNOSIS — G4733 Obstructive sleep apnea (adult) (pediatric): Secondary | ICD-10-CM | POA: Diagnosis not present

## 2022-01-05 DIAGNOSIS — R55 Syncope and collapse: Secondary | ICD-10-CM | POA: Insufficient documentation

## 2022-01-05 LAB — ECHOCARDIOGRAM COMPLETE
Area-P 1/2: 5.02 cm2
MV M vel: 4.81 m/s
MV Peak grad: 92.5 mmHg
S' Lateral: 3.1 cm

## 2022-01-07 ENCOUNTER — Encounter: Payer: Self-pay | Admitting: *Deleted

## 2022-01-07 ENCOUNTER — Other Ambulatory Visit: Payer: Self-pay | Admitting: *Deleted

## 2022-01-07 DIAGNOSIS — I059 Rheumatic mitral valve disease, unspecified: Secondary | ICD-10-CM

## 2022-01-28 ENCOUNTER — Telehealth: Payer: Self-pay | Admitting: Cardiovascular Disease

## 2022-01-28 DIAGNOSIS — I059 Rheumatic mitral valve disease, unspecified: Secondary | ICD-10-CM

## 2022-01-28 DIAGNOSIS — R55 Syncope and collapse: Secondary | ICD-10-CM

## 2022-01-28 NOTE — Telephone Encounter (Signed)
Patient's mother calling to request a referral be sent to CHF clinic for Dr. Gala Romney. She would like a call back when the referral is sent.

## 2022-01-29 NOTE — Telephone Encounter (Signed)
Spoke with pt's mother, Jeffrey Jones (ok per [facility]) regarding a referral to CHF clinic. Pt's mother is upset because she recently read pt's echocardiogram and is alarmed by the impression and does not understand why there didn't receive a call regarding this. Explained to pt's mother that Dr. Allyson Sabal did review these results and based on his review this echo was deemed normal therefore a letter was mailed to their home address with results. Jeffrey Jones states that these results were not received. Pt's mother is concerned by impression as highlighted.   1. Reported hx of VSD repair, no visible device appreciated. Left  ventricular ejection fraction by 3D volume is 63 %. The left ventricle has  normal function. The left ventricle has no regional wall motion  abnormalities. Left ventricular diastolic  parameters are indeterminate.   2. Right ventricular systolic function is normal. The right ventricular  size is mildly enlarged. There is normal pulmonary artery systolic  pressure.   3. Right atrial size was severely dilated.   4. The mitral valve is normal in structure. Mild to moderate mitral valve  regurgitation. No evidence of mitral stenosis.   5. Tricuspid valve regurgitation is severe.   6. The aortic valve is normal in structure. Aortic valve regurgitation is  not visualized. No aortic stenosis is present.   7. The inferior vena cava is normal in size with greater than 50%  respiratory variability, suggesting right atrial pressure of 3 mmHg.   Based on these results pt's mother would like a referral to Heart Failure clinic to follow up on this. Will place order. Also, advised pt on Dr. Hazle Coca recommendations which is to order a 30 day monitor to further assess for abnormal electrical activity of the heart during passing out episode. Explained monitor will come to their home address in 3-5 business days and will send instructions via Mychart. Pt's mother verbalizes understanding.

## 2022-01-30 NOTE — Progress Notes (Unsigned)
Cardiology Office Note:    Date:  02/04/2022   ID:  EHTAN TRANUM, DOB 05-May-1990, MRN [id number]  PCP:  Seward Carol, MD  Cardiologist:  Quay Burow, MD  Electrophysiologist:  None   Referring MD: Seward Carol, MD   Chief Complaint: follow-up of syncope  History of Present Illness:    Jeffrey Jones is a 31 y.o. male with a history of Down's syndrome, VSD s/p repair at 79 months old at [facility], recurrent syncope, and obstructive sleep apnea who is followed by Dr. Gwenlyn Found and presents today for follow-up of syncope.  Patient was recently referred to Dr. Gwenlyn Found on 12/21/2021 for further evaluation of syncope. He has a history of VSD and underwent repair of this at [facility] when he was 41 months old. He has had recurrent syncope for the last 6-8 years. These events have been witnessed He usually has one syncopal episode a year but has had several this year. He reportedly has had an extensive Neurologic work-up. Prior EEG in [address] was normal. Per Neurology note in 10/2021, these episodes frequently happen in the bathroom. One episode occurred when his mother was squeezing a pimple. Neurology notes mentions "slight body quivering" and a few "myoclonic jerks" around these events but no clear seizure like activity.  Head CT in 07/2021 showed no acute intracranial abnormality. EEG was ordered at last visit with Neurology in 10/2021 and was reportedly unremarkable (I am unable to see the actual report from this). Dr. Gwenlyn Found ordered an Echo and 30 day Event Monitor to assess for any structural or arrhythmogenic cause. Of note, he also has a history of obstructive sleep apnea but does not use CPAP. Echo showed LVEF of 63% with indeterminate diastolic parameters, mildly enlarged RV with normal systolic function, severe right atrial enlargement, mild to moderate MR, and severe TR. There was no evidence of VSD.  Patient presents today for follow-up. He is here with his older sister and his  mother is on the phone. His mother expressed frustration in how our office communicated Echo results and lack of communication since then. I apologized for this and reviewed Echo results in detail with her. We reviewed his history of syncope again. He has had recurrent syncopal episodes dating back to 2016 but these episodes have been occurring more regularly lately. He used to only have 1-2 episodes per year but he had 2 episodes in June in one day and then another episode 2 days later. His mother is concerned that these episode looked more cardiac in nature than neurologic because he was sweaty and clammy which sounds vasovagal. However, she also reports that during one of these episodes in June, he had some right sided weakness and mild tremors which does not sound cardiac. Several of these episodes over the years have occurred around the time that patient was in the bathroom which also points to a possible vasovagal cause. However, he has also had episodes while just standing straight up. His mother and sister have not witnessed or heard about patient having any recurrent syncopal episodes since June. However, patient's speech is limited and he unable to communicate fully so we are not able to confirm this with the patient. We are also unable to determine whether patient has had any palpitations, lightheadedness, or dizziness due to this reason. His mother is concerned that these syncopal episodes are due to his severe tricuspid regurgitation but I think this was more of an incidental finding. Patient denies any chest  pain today and he has no obvious shortness of breath. Patient is typically very active and plays sports. His activity has not been significantly limited lately but his mother is concerned about him playing sports with his heart. He has had no obvious CHF symptoms such as orthopnea, PND, or edema. However, his mother is concerned that he does not seem to be quite himself.   He does have known sleep  apnea and does snore some but his mother actually thinks his snoring as improved. He does have a CPAP machine but does not consistently use this.    Of note, patient is still waiting for the Event Monitor to be delivered.  Past Medical History:  Diagnosis Date   Down's syndrome    Seizures ([place])     Past Surgical History:  Procedure Laterality Date   BACK SURGERY     REPAIR OF LEFT VENTRICLE LACERATION      Current Medications: Current Meds  Medication Sig   fexofenadine (ALLEGRA) 180 MG tablet Take 180 mg by mouth daily.   ibuprofen (ADVIL,MOTRIN) 200 MG tablet Take 200 mg by mouth every 6 (six) hours as needed.   Multiple Vitamin (MULTIVITAMIN) tablet Take 1 tablet by mouth daily.   terbinafine (LAMISIL) 250 MG tablet Take 1 tablet (250 mg total) by mouth daily.     Allergies:   Amoxicillin   Social History   Socioeconomic History   Marital status: Single    Spouse name: Not on file   Number of children: 0   Years of education: HS   Highest education level: Not on file  Occupational History   Occupation: N/A  Tobacco Use   Smoking status: Never   Smokeless tobacco: Never  Vaping Use   Vaping Use: Never used  Substance and Sexual Activity   Alcohol use: No   Drug use: No   Sexual activity: Not on file  Other Topics Concern   Not on file  Social History Narrative   Patient occasionally drinks soft drinks.   Patient is right handed but does other tasks with his left hand.          Social Determinants of Health   Financial Resource Strain: Not on file  Food Insecurity: Not on file  Transportation Needs: Not on file  Physical Activity: Not on file  Stress: Not on file  Social Connections: Not on file     Family History: The patient's family history includes Bell's palsy in his father; Diabetes in his father. There is no history of Seizures.  ROS:   Please see the history of present illness.     EKGs/Labs/Other Studies Reviewed:    The following  studies were reviewed:  Echocardiogram 01/05/2022: Impressions:  1. Reported hx of VSD repair, no visible device appreciated. Left  ventricular ejection fraction by 3D volume is 63 %. The left ventricle has  normal function. The left ventricle has no regional wall motion  abnormalities. Left ventricular diastolic  parameters are indeterminate.   2. Right ventricular systolic function is normal. The right ventricular  size is mildly enlarged. There is normal pulmonary artery systolic  pressure.   3. Right atrial size was severely dilated.   4. The mitral valve is normal in structure. Mild to moderate mitral valve  regurgitation. No evidence of mitral stenosis.   5. Tricuspid valve regurgitation is severe.   6. The aortic valve is normal in structure. Aortic valve regurgitation is  not visualized. No aortic stenosis is present.  7. The inferior vena cava is normal in size with greater than 50%  respiratory variability, suggesting right atrial pressure of 3 mmHg.    EKG:  EKG not ordered today.   Recent Labs: 08/11/2021: Hemoglobin 17.1; Platelets 153 12/22/2021: ALT 11 02/04/2022: BNP WILL FOLLOW; BUN 13; Creatinine, Ser 1.18; Potassium 4.5; Sodium 140  Recent Lipid Panel No results found for: "CHOL", "TRIG", "HDL", "CHOLHDL", "VLDL", "LDLCALC", "LDLDIRECT"  Physical Exam:    Vital Signs: BP 99/61   Pulse 84   Ht 5\' 4"  (1.626 m)   Wt 210 lb (95.3 kg)   SpO2 97%   BMI 36.05 kg/m     Wt Readings from Last 3 Encounters:  02/04/22 210 lb (95.3 kg)  12/21/21 207 lb (93.9 kg)  11/25/21 211 lb 3.2 oz (95.8 kg)     General: 31 y.o. African-American male in no acute distress. HEENT: Normocephalic and atraumatic. Sclera clear. Neck: Supple. No JVD. Heart:  RRR. Distinct S1 and S2. No murmurs, gallops, or rubs.  Lungs: No increased work of breathing. Clear to ausculation bilaterally. No wheezes, rhonchi, or rales.  Abdomen: Soft, non-distended, and non-tender to palpation.   Extremities: No lower extremity edema.    Skin: Warm and dry. Neuro: No focal deficits. Psych: Normal affect. Responds appropriately.  Assessment:    1. Recurrent syncope   2. Severe tricuspid regurgitation   3. Mild to moderate mitral insufficiency   4. S/P VSD repair   5. Snoring   6. Obstructive sleep apnea   7. Medication management     Plan:    Recurrent Syncope Patient has a history of recurrent syncope dating back to 2016. Prior Neurologic work-up including head CT and EEG have been negative. He usually has 1-2 episodes per year but had multiple episodes within a couple of days earlier this year in June. Head CT in 07/2021 showed no acute intracranial abnormalities. He was seen by Neurology who ordered repeat EEG in 10/2021 which was reportedly unremarkable. It does not sound like he has had any recurrent episodes of syncope since 07/2021; however, patient's speech is limited and he is not able to fully communicate so unable to confirm this with patient. Recent Echo in 12/2021 showed LVEF of 63%, mildly enlarged RV with normal systolic function, severe right atrial enlargement, mild to moderate MR, and severe TR. Some of his syncopal episodes sound vasovagal in nature. Event Monitor has been ordered and is pending. If this does not show any concerning arrhythmias, suspect he will need a loop recorder.   Mild to Moderate Mitral Regurgitation Severe Tricuspid Regurgitation Echo on 01/05/2022 showed severe tricuspid regurgitation with severe right atrial enlargement and mildly enlarged RV but RV systolic function is normal. Also showed mild to moderate mitral regurgitation. Patient is asymptomatic with this. Discussed with patient's mother that we usually treat tricuspid regurgitation medically (especially if patient is asymptomatic) and that isolated tricuspid valve surgery is not common. However, given that he also has some mitral valve disease, he may require mitral valve repair at some  point in the future at which time repair of his tricuspid valve would also be considered. Plan for now is to treat medically and repeat an Echo in 1 year. He has no signs or symptoms of CHF but given patient's speech is limited and it is difficult to get a detailed history, will order BNP and BMET. Per review of UpToDate, "patients with primary TR with normal RV function, a right atrial pressure less than 20 mmHg,  and normal RV systolic pressure can participate in all competitive sports, regardless of TR severity." Per this recommendation patient would be okay to continue participating in sports. Patient's mother is understandably very concerned about his severe tricuspid regurgitation. Therefore, I will also review this with the Structural Heart Team and make sure they do not have any additional recommendations.   S/p VSD Repair S/p VSD repair at 36 months old. Recent Echo showed no visible device appreciated but also did not mention any obvious residual VSD.  Obstructive Sleep Apnea Snoring History of OSA. He does have a CPAP machine but does not consistently use this. Recommended compliance with CPAP.  Disposition: Follow up in 2-3 months after Event Monitor.   I spent 45 minutes reviewing records, examining patient, discussing test results and plan with patient/ family. I spent at least an additional 40 minutes documenting.  Medication Adjustments/Labs and Tests Ordered: Current medicines are reviewed at length with the patient today.  Concerns regarding medicines are outlined above.  Orders Placed This Encounter  Procedures   Basic metabolic panel   Brain natriuretic peptide   No orders of the defined types were placed in this encounter.   Patient Instructions  Medication Instructions:  Your physician recommends that you continue on your current medications as directed. Please refer to the Current Medication list given to you today.   *If you need a refill on your cardiac medications  before your next appointment, please call your pharmacy*   Lab Work: Your physician recommends that you complete lab work today. BMET & BNP  If you have labs (blood work) drawn today and your tests are completely normal, you will receive your results only by: MyChart Message (if you have MyChart) OR A paper copy in the mail If you have any lab test that is abnormal or we need to change your treatment, we will call you to review the results.   Testing/Procedures: NONE ordered at this time of appointment    Follow-Up: At [facility], you and your health needs are our priority.  As part of our continuing mission to provide you with exceptional heart care, we have created designated Provider Care Teams.  These Care Teams include your primary Cardiologist (physician) and Advanced Practice Providers (APPs -  Physician Assistants and Nurse Practitioners) who all work together to provide you with the care you need, when you need it.  We recommend signing up for the patient portal called "MyChart".  Sign up information is provided on this After Visit Summary.  MyChart is used to connect with patients for Virtual Visits (Telemedicine).  Patients are able to view lab/test results, encounter notes, upcoming appointments, etc.  Non-urgent messages can be sent to your provider as well.   To learn more about what you can do with MyChart, go to [email or web address].    Your next appointment:   3 month(s)  The format for your next appointment:   In Person  Provider:   Marjie Skiff, PA-C        Other Instructions   Important Information About Sugar         Signed, Corrin Parker, PA-C  02/04/2022 8:20 PM    [facility]

## 2022-02-04 ENCOUNTER — Encounter: Payer: Self-pay | Admitting: Student

## 2022-02-04 ENCOUNTER — Ambulatory Visit: Payer: Medicare Other | Attending: Student | Admitting: Student

## 2022-02-04 VITALS — BP 99/61 | HR 84 | Ht 64.0 in | Wt 210.0 lb

## 2022-02-04 DIAGNOSIS — Z8774 Personal history of (corrected) congenital malformations of heart and circulatory system: Secondary | ICD-10-CM | POA: Insufficient documentation

## 2022-02-04 DIAGNOSIS — R55 Syncope and collapse: Secondary | ICD-10-CM

## 2022-02-04 DIAGNOSIS — I059 Rheumatic mitral valve disease, unspecified: Secondary | ICD-10-CM | POA: Diagnosis not present

## 2022-02-04 DIAGNOSIS — Z79899 Other long term (current) drug therapy: Secondary | ICD-10-CM

## 2022-02-04 DIAGNOSIS — I340 Nonrheumatic mitral (valve) insufficiency: Secondary | ICD-10-CM | POA: Diagnosis not present

## 2022-02-04 DIAGNOSIS — R0683 Snoring: Secondary | ICD-10-CM | POA: Insufficient documentation

## 2022-02-04 DIAGNOSIS — I071 Rheumatic tricuspid insufficiency: Secondary | ICD-10-CM | POA: Diagnosis not present

## 2022-02-04 DIAGNOSIS — G4733 Obstructive sleep apnea (adult) (pediatric): Secondary | ICD-10-CM | POA: Diagnosis not present

## 2022-02-04 NOTE — Patient Instructions (Signed)
Medication Instructions:  Your physician recommends that you continue on your current medications as directed. Please refer to the Current Medication list given to you today.   *If you need a refill on your cardiac medications before your next appointment, please call your pharmacy*   Lab Work: Your physician recommends that you complete lab work today. BMET & BNP  If you have labs (blood work) drawn today and your tests are completely normal, you will receive your results only by: MyChart Message (if you have MyChart) OR A paper copy in the mail If you have any lab test that is abnormal or we need to change your treatment, we will call you to review the results.   Testing/Procedures: NONE ordered at this time of appointment    Follow-Up: At [facility], you and your health needs are our priority.  As part of our continuing mission to provide you with exceptional heart care, we have created designated Provider Care Teams.  These Care Teams include your primary Cardiologist (physician) and Advanced Practice Providers (APPs -  Physician Assistants and Nurse Practitioners) who all work together to provide you with the care you need, when you need it.  We recommend signing up for the patient portal called "MyChart".  Sign up information is provided on this After Visit Summary.  MyChart is used to connect with patients for Virtual Visits (Telemedicine).  Patients are able to view lab/test results, encounter notes, upcoming appointments, etc.  Non-urgent messages can be sent to your provider as well.   To learn more about what you can do with MyChart, go to [email or web address].    Your next appointment:   3 month(s)  The format for your next appointment:   In Person  Provider:   Marjie Skiff, PA-C        Other Instructions   Important Information About Sugar

## 2022-02-05 ENCOUNTER — Telehealth: Payer: Self-pay

## 2022-02-05 ENCOUNTER — Ambulatory Visit: Payer: Medicare Other | Attending: Cardiovascular Disease

## 2022-02-05 DIAGNOSIS — R55 Syncope and collapse: Secondary | ICD-10-CM | POA: Diagnosis not present

## 2022-02-05 DIAGNOSIS — I059 Rheumatic mitral valve disease, unspecified: Secondary | ICD-10-CM

## 2022-02-05 DIAGNOSIS — R569 Unspecified convulsions: Secondary | ICD-10-CM

## 2022-02-05 DIAGNOSIS — Q909 Down syndrome, unspecified: Secondary | ICD-10-CM

## 2022-02-05 LAB — BASIC METABOLIC PANEL
BUN/Creatinine Ratio: 11 (ref 9–20)
BUN: 13 mg/dL (ref 6–20)
CO2: 23 mmol/L (ref 20–29)
Calcium: 9.1 mg/dL (ref 8.7–10.2)
Chloride: 103 mmol/L (ref 96–106)
Creatinine, Ser: 1.18 mg/dL (ref 0.76–1.27)
Glucose: 103 mg/dL — ABNORMAL HIGH (ref 70–99)
Potassium: 4.5 mmol/L (ref 3.5–5.2)
Sodium: 140 mmol/L (ref 134–144)
eGFR: 86 mL/min/{1.73_m2} (ref >59)

## 2022-02-05 LAB — BRAIN NATRIURETIC PEPTIDE: BNP: 21.7 pg/mL (ref 0.0–100.0)

## 2022-02-06 DIAGNOSIS — R55 Syncope and collapse: Secondary | ICD-10-CM | POA: Diagnosis not present

## 2022-02-08 ENCOUNTER — Telehealth: Payer: Self-pay | Admitting: *Deleted

## 2022-02-08 NOTE — Progress Notes (Signed)
  Care Coordination  Outreach Note  02/08/2022 Name: Jeffrey Jones MRN: [id number] DOB: [birth year]   Care Coordination Outreach Attempts: An unsuccessful telephone outreach was attempted today to offer the patient information about available care coordination services as a benefit of their health plan. .  Referral received   Follow Up Plan:  Additional outreach attempts will be made to offer the patient care coordination information and services.   Encounter Outcome:  No Answer  Burman Nieves, CCMA Care Coordination Care Guide Direct Dial: [phone]

## 2022-02-08 NOTE — Progress Notes (Signed)
  Care Coordination   Note   02/08/2022 Name: Jeffrey Jones MRN: [id number] DOB: 08-13-1990  Jeffrey Jones is a 31 y.o. year old male who sees Renford Dills, MD for primary care. I reached out to Jeffrey Jones by phone today to offer care coordination services.  Mr. Hayhurst was given information about Care Coordination services today including:   The Care Coordination services include support from the care team which includes your Nurse Coordinator, Clinical Social Worker, or Pharmacist.  The Care Coordination team is here to help remove barriers to the health concerns and goals most important to you. Care Coordination services are voluntary, and the patient may decline or stop services at any time by request to their care team member.   Care Coordination Consent Status: Patient agreed to services and verbal consent obtained.   Follow up plan:  Telephone appointment with care coordination team member scheduled for:  02/12/2022  Encounter Outcome:  Pt. Scheduled from referral   Burman Nieves, [facility] Care Coordination Care Guide Direct Dial: [phone]

## 2022-02-09 ENCOUNTER — Telehealth (HOSPITAL_COMMUNITY): Payer: Self-pay | Admitting: Surgery

## 2022-02-09 ENCOUNTER — Telehealth: Payer: Self-pay

## 2022-02-09 NOTE — Telephone Encounter (Signed)
I called and spoke with Ms Schellhorn regarding referral to the AHF Clinic for her son.  I explained that per the chart notes it seems that he does not have HF and no indication to be seen in the AHF clinic.  I have spoken with Coordinator at [facility] and they will forward message to Structural Heart team navigator for further review.

## 2022-02-09 NOTE — Telephone Encounter (Signed)
   Telephone encounter was:  Unsuccessful.  02/09/2022 Name: Jeffrey Jones MRN: [id number] DOB: [birth year]  Unsuccessful outbound call made today to assist with:  Transportation Needs   Outreach Attempt:  1st Attempt  A HIPAA compliant voice message was left requesting a return call.  Instructed patient to call back    Lenard Forth [facility] Guide, [facility], Care Management  [phone] [address] [place], [place], [place] [address] Phone: [phone] Email: [email or web address]

## 2022-02-10 ENCOUNTER — Telehealth: Payer: Self-pay

## 2022-02-10 NOTE — Telephone Encounter (Signed)
   Telephone encounter was:  Successful.  02/10/2022 Name: GEOFFERY AULTMAN MRN: [id number] DOB: 1990-06-05  [name] is a 31 y.o. year old male who is a primary care patient of Polite, Windy Fast, MD . The community resource team was consulted for assistance with Transportation Needs   Care guide performed the following interventions: Patient provided with information about care guide support team and interviewed to confirm resource needs. Patient is in need of transportation resources. I will mail and email resources to the patient   Follow Up Plan:  No further follow up planned at this time. The patient has been provided with needed resources.    Lenard Forth [facility] Guide, [facility], Care Management  [phone] [address] [place], [place], [place] [address] Phone: [phone] Email: [email or web address]

## 2022-02-12 ENCOUNTER — Telehealth: Payer: Self-pay

## 2022-02-12 NOTE — Patient Outreach (Signed)
  Care Coordination   02/12/2022 Name: Jeffrey Jones MRN: [id number] DOB: 1990-06-07   Care Coordination Outreach Attempts:  An unsuccessful telephone outreach was attempted for a scheduled appointment today.  Follow Up Plan:  Additional outreach attempts will be made to offer the patient care coordination information and services.   Encounter Outcome:  No Answer   Care Coordination Interventions:  No, not indicated    Kathyrn Sheriff, RN, MSN, BSN, CCM [facility] Care Coordinator [phone]

## 2022-02-15 ENCOUNTER — Telehealth: Payer: Self-pay | Admitting: Cardiovascular Disease

## 2022-02-15 NOTE — Telephone Encounter (Signed)
Critical result:  12:48 AM.  Secondary type one 20-30 bpm.   Will be sent to the portal and faxed over.   I will be on the lookout.

## 2022-02-15 NOTE — Telephone Encounter (Signed)
Marcelino Duster with [employer] with critical monitor readings.

## 2022-02-15 NOTE — Telephone Encounter (Signed)
Received fax of results from critical result.   Patient mother states he has downs syndrome and is unable to communicate if he knew anything was occurring, however the episode happened at 11:09 PM, and patient was asleep.   He does have dx of sleep apnea, and is not using CPAP machine, I did recommend they reach out to sleep doctor to discuss further options with this as well. Mother states this was next on the list to do, I also left report with DOD to review and sign off to give any other recommendations.   Upcoming appointment with PA on 05/18/22.

## 2022-02-16 ENCOUNTER — Telehealth: Payer: Self-pay | Admitting: *Deleted

## 2022-02-16 NOTE — Progress Notes (Signed)
  Care Coordination Note  02/16/2022 Name: WALDON SHEERIN MRN: [id number] DOB: 1990-04-23  Jeffrey Jones is a 31 y.o. year old male who is a primary care patient of Polite, Windy Fast, MD and is actively engaged with the care management team. I reached out to Jeffrey Jones by phone today to assist with re-scheduling an initial visit with the RN Case Manager  Follow up plan: Unsuccessful telephone outreach attempt made. A HIPAA compliant phone message was left for the patient providing contact information and requesting a return call.   Burman Nieves, CCMA Care Coordination Care Guide Direct Dial: [phone]

## 2022-02-19 ENCOUNTER — Telehealth: Payer: Self-pay

## 2022-02-19 NOTE — Telephone Encounter (Signed)
Received fax of critical results from [employer].   Called pt's mother states he has downs syndrome and is unable to communicate if he knew anything was occurring, however she states that the pt is up and down all night every night so there is no way of telling if he is up because the is occurring or not.  Spoke with DOD Dr Antoine Poche, he states that pt need to be seen before scheduled follow up. Maybe sometime next week. Called pt's mother and scheduled appointment Tuesday 12-26 @ 1:30pm for eval. Mother wants to discuss  CPAP she will bring it with her to switch providers and further direction on a better mask for him to wear.

## 2022-02-23 ENCOUNTER — Encounter: Payer: Self-pay | Admitting: Cardiovascular Disease

## 2022-02-23 ENCOUNTER — Ambulatory Visit: Payer: Medicare Other | Attending: Cardiovascular Disease | Admitting: Cardiovascular Disease

## 2022-02-23 VITALS — BP 110/64 | HR 74 | Wt 211.0 lb

## 2022-02-23 DIAGNOSIS — G4733 Obstructive sleep apnea (adult) (pediatric): Secondary | ICD-10-CM | POA: Diagnosis not present

## 2022-02-23 DIAGNOSIS — R55 Syncope and collapse: Secondary | ICD-10-CM | POA: Diagnosis not present

## 2022-02-23 DIAGNOSIS — I071 Rheumatic tricuspid insufficiency: Secondary | ICD-10-CM | POA: Insufficient documentation

## 2022-02-23 NOTE — Assessment & Plan Note (Signed)
History of obstructive sleep apnea on CPAP followed by Dr. Theressa Millard.

## 2022-02-23 NOTE — Assessment & Plan Note (Signed)
Recent 2D echo performed 01/05/2022 showed severe TR, mild to moderate MR and normal biventricular function without evidence of PS or PI.  I am going to get a cardiac MRI to further evaluate may refer him to specialist and congenital heart disease for further evaluation.

## 2022-02-23 NOTE — Patient Instructions (Signed)
Medication Instructions:  Your physician recommends that you continue on your current medications as directed. Please refer to the Current Medication list given to you today.  *If you need a refill on your cardiac medications before your next appointment, please call your pharmacy*   Testing/Procedures: Your physician has requested that you have a cardiac MRI. Cardiac MRI uses a computer to create images of your heart as its beating, producing both still and moving pictures of your heart and major blood vessels. For further information please visit [email or web address]. Please follow the instruction sheet given to you today for more information. This procedure will be done at [address] [place], [place] [address]    Follow-Up: At [facility], you and your health needs are our priority.  As part of our continuing mission to provide you with exceptional heart care, we have created designated Provider Care Teams.  These Care Teams include your primary Cardiologist (physician) and Advanced Practice Providers (APPs -  Physician Assistants and Nurse Practitioners) who all work together to provide you with the care you need, when you need it.  We recommend signing up for the patient portal called "MyChart".  Sign up information is provided on this After Visit Summary.  MyChart is used to connect with patients for Virtual Visits (Telemedicine).  Patients are able to view lab/test results, encounter notes, upcoming appointments, etc.  Non-urgent messages can be sent to your provider as well.   To learn more about what you can do with MyChart, go to [email or web address].    Your next appointment:   6 month(s)  The format for your next appointment:   In Person  Provider:   Nanetta Batty, MD     Other Instructions Dr. Allyson Sabal would like to you to see Dr. Royann Shivers to further discuss loop recorder implantation.

## 2022-02-23 NOTE — Progress Notes (Signed)
02/23/2022 Jeffrey Jones   Jeffrey Jones  [id number]  Primary Physician Renford Dills, MD Primary Cardiologist: Runell Gess MD Jeffrey Jones, [place], [place]  HPI:  Jeffrey Jones is a 31 y.o.  mildly overweight single African-American male accompanied by his mother Jeffrey Jones today.  I last saw him in the office 12/21/2021.  He has a trisomy 21 patient.  He had a VSD repair at [facility] at age 67 months and C1-2  to surgery at [facility].  He has no cardiac risk factors.  He has speech impediment as well and is difficult to communicate.  He is somewhat active playing sports including basketball football and golf.  He has had episodes of syncope that have been witnessed going back 6 to 8 years without obvious seizure activity.  These episodes occur at least once a year although has had several this year.  He had extensive neurologic work-up without a diagnosis.  I obtained a 2D echocardiogram on him 01/05/2022 that showed normal biventricular function, severe TR, mild to moderate MR with normal PA pressures and no evidence of PS.  Event monitor did show multiple episodes up to 1 heart block with pauses longest 3 seconds.     Current Meds  Medication Sig   fexofenadine (ALLEGRA) 180 MG tablet Take 180 mg by mouth daily.   ibuprofen (ADVIL,MOTRIN) 200 MG tablet Take 200 mg by mouth every 6 (six) hours as needed.   Multiple Vitamin (MULTIVITAMIN) tablet Take 1 tablet by mouth daily.   terbinafine (LAMISIL) 250 MG tablet Take 1 tablet (250 mg total) by mouth daily.     Allergies  Allergen Reactions   Amoxicillin     Social History   Socioeconomic History   Marital status: Single    Spouse name: Not on file   Number of children: 0   Years of education: HS   Highest education level: Not on file  Occupational History   Occupation: N/A  Tobacco Use   Smoking status: Never   Smokeless tobacco: Never  Vaping Use   Vaping Use: Never used  Substance and Sexual  Activity   Alcohol use: No   Drug use: No   Sexual activity: Not on file  Other Topics Concern   Not on file  Social History Narrative   Patient occasionally drinks soft drinks.   Patient is right handed but does other tasks with his left hand.          Social Determinants of Health   Financial Resource Strain: Not on file  Food Insecurity: Not on file  Transportation Needs: Not on file  Physical Activity: Not on file  Stress: Not on file  Social Connections: Not on file  Intimate Partner Violence: Not on file     Review of Systems: General: negative for chills, fever, night sweats or weight changes.  Cardiovascular: negative for chest pain, dyspnea on exertion, edema, orthopnea, palpitations, paroxysmal nocturnal dyspnea or shortness of breath Dermatological: negative for rash Respiratory: negative for cough or wheezing Urologic: negative for hematuria Abdominal: negative for nausea, vomiting, diarrhea, bright red blood per rectum, melena, or hematemesis Neurologic: negative for visual changes, syncope, or dizziness All other systems reviewed and are otherwise negative except as noted above.    Blood pressure 110/64, pulse 74, weight 211 lb (95.7 kg), SpO2 95 %.  General appearance: alert and no distress Neck: no adenopathy, no carotid bruit, no JVD, supple, symmetrical, trachea midline, and thyroid not enlarged,  symmetric, no tenderness/mass/nodules Lungs: clear to auscultation bilaterally Heart: Soft apical systolic murmur consistent with mitral vegetation. Extremities: extremities normal, atraumatic, no cyanosis or edema Pulses: 2+ and symmetric Skin: Skin color, texture, turgor normal. No rashes or lesions Neurologic: Grossly normal  EKG not performed today  ASSESSMENT AND PLAN:   Obstructive sleep apnea History of obstructive sleep apnea on CPAP followed by Dr. Theressa Millard.  Syncope and collapse History of syncope and collapse.  He did have an event  monitor that showed 2-1 heart block.  There is a question whether his symptoms are vasovagal as well.  I am going to send him to Dr. Royann Shivers to discuss the possibility of loop recorder implantation to further evaluate.  Severe tricuspid regurgitation Recent 2D echo performed 01/05/2022 showed severe TR, mild to moderate MR and normal biventricular function without evidence of PS or PI.  I am going to get a cardiac MRI to further evaluate may refer him to specialist and congenital heart disease for further evaluation.     Runell Gess MD FACP,FACC,FAHA, [facility] 02/23/2022 2:07 PM

## 2022-02-23 NOTE — Assessment & Plan Note (Signed)
History of syncope and collapse.  He did have an event monitor that showed 2-1 heart block.  There is a question whether his symptoms are vasovagal as well.  I am going to send him to Dr. Royann Shivers to discuss the possibility of loop recorder implantation to further evaluate.

## 2022-02-25 NOTE — Telephone Encounter (Signed)
See appointment notes from 02/23/22

## 2022-02-26 NOTE — Progress Notes (Signed)
  Care Coordination Note  02/26/2022 Name: TRAVONTAE FREIBERGER MRN: [id number] DOB: 1990-03-29  Jeffrey Jones is a 31 y.o. year old male who is a primary care patient of Polite, Windy Fast, MD and is actively engaged with the care management team. I reached out to Jeffrey Jones by phone today to assist with re-scheduling an initial visit with the RN Case Manager  Follow up plan: Telephone appointment with care management team member scheduled for: 03/11/2022  Burman Nieves, [facility] Care Coordination Care Guide Direct Dial: [phone]

## 2022-03-10 ENCOUNTER — Ambulatory Visit: Payer: Medicare Other | Attending: Cardiovascular Disease | Admitting: Cardiovascular Disease

## 2022-03-10 ENCOUNTER — Encounter: Payer: Self-pay | Admitting: Cardiovascular Disease

## 2022-03-10 VITALS — BP 98/61 | HR 72 | Ht 64.0 in | Wt 203.8 lb

## 2022-03-10 DIAGNOSIS — Z8774 Personal history of (corrected) congenital malformations of heart and circulatory system: Secondary | ICD-10-CM | POA: Diagnosis not present

## 2022-03-10 DIAGNOSIS — I441 Atrioventricular block, second degree: Secondary | ICD-10-CM

## 2022-03-10 DIAGNOSIS — I071 Rheumatic tricuspid insufficiency: Secondary | ICD-10-CM

## 2022-03-10 DIAGNOSIS — G4733 Obstructive sleep apnea (adult) (pediatric): Secondary | ICD-10-CM

## 2022-03-10 DIAGNOSIS — R55 Syncope and collapse: Secondary | ICD-10-CM

## 2022-03-10 NOTE — Patient Instructions (Addendum)
Medication Instructions:  Your physician recommends that you continue on your current medications as directed. Please refer to the Current Medication list given to you today.  *If you need a refill on your cardiac medications before your next appointment, please call your pharmacy*   Lab Work: NONE ordered at this time of appointment   If you have labs (blood work) drawn today and your tests are completely normal, you will receive your results only by: [place] (if you have MyChart) OR A paper copy in the mail If you have any lab test that is abnormal or we need to change your treatment, we will call you to review the results.  Testing/Procedures: Your physician has order a loop recorder implant  Follow-Up: At [facility], you and your health needs are our priority.  As part of our continuing mission to provide you with exceptional heart care, we have created designated Provider Care Teams.  These Care Teams include your primary Cardiologist (physician) and Advanced Practice Providers (APPs -  Physician Assistants and Nurse Practitioners) who all work together to provide you with the care you need, when you need it.   Your next appointment:   2 week(s) post implant Virtual   Provider:   Dr. Sallyanne Kuster      Other Instructions     [facility] [place], [address]  [place], [place] [address]  Phone: [phone] Fax: [phone]   You are scheduled for a Loop Recorder Implant on 04/08/22  with Dr. Sallyanne Kuster. This will take place at [place], [address].    Please arrive at your appointment 15-20 minutes early.   You do not need to be fasting.   The procedure is performed with local anesthesia. You will not receive sedatives nor will an IV be placed.   Wash your chest and neck with the surgical soap the evening before and the morning of your procedure. Please following the washing instructions provided.   As with all surgical implants,  there is a small risk of infection. If an infection occurs, the device will be removed. To help reduce the risk, please use the surgical scrub provided. Additional antiseptic precautions will be taken at the time of the procedure.   Please bring your insurance cards.   You will be scheduled for a two week wound check visit with Dr. Sallyanne Kuster. This will be a video visit. If you are unable to do a video visit then you may come into the office.  *Please note that scheduled loop recorder implants may need to be rescheduled if Dr. Sallyanne Kuster has a procedure urgently added on at the hospital   Preparing for the Procedure  Before the procedure, you can play an important role. Because skin is not sterile, your skin needs to be as free of germs as possible. You can reduce the number of germs on your skin by washing with CHG (chlorhexidine gluconate) Soap before the procedure. CHG is an antiseptic cleaner which kills germs and bonds with the skin to continue killing germs even after washing.  Please do not use if you have an allergy to CHG or antibacterial soaps. If your skin becomes reddened/irritated, STOP using the CHG.  DO NOT SHAVE (including legs and underarms) for at least 48 hours prior to first CHG shower. It is OK to shave your face.  Please follow these instructions carefully: Shower the night before the procedure and the morning of with CHG Soap. If you chose to wash your  hair, wash your hair first as usual with your normal shampoo/conditioner. After you shampoo/condition, rinse you hair and body thoroughly to remove shampoo/conditioner. Use CHG as you would any other liquid soap. You can apply CHG directly to the skin and wash gently with a loofah or a clean washcloth. Apply the CHG Soap to your body ONLY FROM THE NECK DOWN. Do not use on open wounds or open sores. Avoid contact with your eyes, ears, mouth, and genitals (private parts).  Wash thoroughly, paying special attention to the area  where your surgery will be performed. Thoroughly rinse your body with warm water from the neck down. DO NOT shower/wash with your normal soap after using and rinsing off the CHG Soap. Pat yourself dry with a clean towel. Wear clean pajamas to bed. Place clean sheets on your bed the night of your first shower and do not sleep with pets..  Day of Surgery: Shower with the CHG Soap following the instructions listed above. DO NOT apply deodorants or lotions.

## 2022-03-12 ENCOUNTER — Telehealth (HOSPITAL_COMMUNITY): Payer: Self-pay | Admitting: Emergency Medicine

## 2022-03-12 ENCOUNTER — Ambulatory Visit: Payer: Self-pay

## 2022-03-12 ENCOUNTER — Encounter: Payer: Self-pay | Admitting: Cardiovascular Disease

## 2022-03-12 NOTE — Telephone Encounter (Signed)
Attempted to call patient regarding upcoming cardiac MR appointment. Left message on voicemail with name and callback number Sian Rockers RN Navigator Cardiac [facility] [phone] Office [phone] Cell  

## 2022-03-12 NOTE — Patient Outreach (Signed)
  Care Coordination   03/12/2022 Name: Jeffrey Jones MRN: [id number] DOB: 1990/11/11   Care Coordination Outreach Attempts:  An unsuccessful telephone outreach was attempted today to offer the patient information about available care coordination services as a benefit of their health plan.   Follow Up Plan:  Additional outreach attempts will be made to offer the patient care coordination information and services.   Encounter Outcome:  No Answer   Care Coordination Interventions:  No, not indicated    Peter Garter RN, BSN,CCM, CDE Care Management Coordinator [place] [facility] [phone]

## 2022-03-12 NOTE — Progress Notes (Signed)
Cardiology Office Note:    Date:  03/12/2022   ID:  Jeffrey Jones, DOB December 05, 1990, MRN [id number]  PCP:  Renford Dills, MD   [facility] Providers Cardiologist:  Nanetta Batty, MD     Referring MD: Renford Dills, MD   No chief complaint on file. Jeffrey Jones is a 32 y.o. male who is being seen today for the evaluation of syncope and loop recorder implantation at the request of Nanetta Batty, MD   History of Present Illness:    Jeffrey Jones is a 32 y.o. male with a hx of trisomy 59, history of ventricular septal defect patch repair at age 79 months, OSA (not on CPAP), severe tricuspid regurgitation and seizure disorder.  Overall he appears to understand our discussion, he does not speak during the interaction today and the history was obtained from his mother.  He has had numerous episodes of syncope without frank convulsions going back the last 6-7 years.  Often these episodes have not been witnessed and his mother had something to do stat to have occurred from injuries.  Neurological workup has been extensive without yielding the diagnosis.    Recently his mother witnessed 2 syncopal events in 1 day.  On both occasions he was slightly sweaty/clammy and recovered quickly after falling to the floor.  At least with the second event there appeared to be a suggestion of lengthy prodrome.  He has known obstructive sleep apnea but will not cooperate with CPAP.  Her recent echocardiogram shows normal right and left ventricle systolic function but he has severe tricuspid regurgitation and severe right atrial dilation despite normal PA pressures.  There is no evidence of pulmonic stenosis.    He wore an event monitor that showed multiple episodes of second-degree AV block mostly 2:1,  also second degree Mobitz 2 AV block (2 consecutive blocked P waves, but with narrow QRS)  with pauses up to 3 seconds in duration.  There was also a 4 beat run of NSVT followed by a 3.6 second pause.  All these events occurred at night.  Past Medical History:  Diagnosis Date   Down's syndrome    Seizures (HCC)     Past Surgical History:  Procedure Laterality Date   BACK SURGERY     REPAIR OF LEFT VENTRICLE LACERATION      Current Medications: Current Meds  Medication Sig   Multiple Vitamin (MULTIVITAMIN) tablet Take 1 tablet by mouth daily.   terbinafine (LAMISIL) 250 MG tablet Take 1 tablet (250 mg total) by mouth daily.     Allergies:   Patient has no active allergies.   Social History   Socioeconomic History   Marital status: Single    Spouse name: Not on file   Number of children: 0   Years of education: HS   Highest education level: Not on file  Occupational History   Occupation: N/A  Tobacco Use   Smoking status: Never   Smokeless tobacco: Never  Vaping Use   Vaping Use: Never used  Substance and Sexual Activity   Alcohol use: No   Drug use: No   Sexual activity: Not on file  Other Topics Concern   Not on file  Social History Narrative   Patient occasionally drinks soft drinks.   Patient is right handed but does other tasks with his left hand.          Social Determinants of Health   Financial Resource Strain: Not on file  Food Insecurity:  Not on file  Transportation Needs: Not on file  Physical Activity: Not on file  Stress: Not on file  Social Connections: Not on file     Family History: The patient's family history includes Bell's palsy in his father; Diabetes in his father. There is no history of Seizures.  ROS:   Please see the history of present illness.     All other systems reviewed and are negative.  EKGs/Labs/Other Studies Reviewed:    The following studies were reviewed today: Event monitor 03/08/2022  1. SR/SB/ST 2. 2:1 HB and third degree AVB with pauses in the 3 second range. Needs an appointment to see EP this week.  Echocardiogram 01/05/2022    1. Reported hx of VSD repair, no visible device appreciated. Left   ventricular ejection fraction by 3D volume is 63 %. The left ventricle has  normal function. The left ventricle has no regional wall motion  abnormalities. Left ventricular diastolic  parameters are indeterminate.   2. Right ventricular systolic function is normal. The right ventricular  size is mildly enlarged. There is normal pulmonary artery systolic  pressure.   3. Right atrial size was severely dilated.   4. The mitral valve is normal in structure. Mild to moderate mitral valve  regurgitation. No evidence of mitral stenosis.   5. Tricuspid valve regurgitation is severe.   6. The aortic valve is normal in structure. Aortic valve regurgitation is  not visualized. No aortic stenosis is present.   7. The inferior vena cava is normal in size with greater than 50%  respiratory variability, suggesting right atrial pressure of 3 mmHg.   EKG:  EKG is not ordered today.  The ekg ordered 12/21/2021 is personally reviewed and demonstrates sinus rhythm with a very long first-degree AV block, probably biatrial enlargement  Recent Labs: 08/11/2021: Hemoglobin 17.1; Platelets 153 12/22/2021: ALT 11 02/04/2022: BNP 21.7; BUN 13; Creatinine, Ser 1.18; Potassium 4.5; Sodium 140  Recent Lipid Panel No results found for: "CHOL", "TRIG", "HDL", "CHOLHDL", "VLDL", "LDLCALC", "LDLDIRECT"   Risk Assessment/Calculations:       Physical Exam:    VS:  BP 98/61 (BP Location: Left Arm, Patient Position: Sitting, Cuff Size: Large)   Pulse 72   Ht 5\' 4"  (1.626 m)   Wt 203 lb 12.8 oz (92.4 kg)   SpO2 98%   BMI 34.98 kg/m     Wt Readings from Last 3 Encounters:  03/10/22 203 lb 12.8 oz (92.4 kg)  02/23/22 211 lb (95.7 kg)  02/04/22 210 lb (95.3 kg)     GEN: Moderately obese, well nourished, well developed in no acute distress.  Trisomy 21 phenotype HEENT: Normal NECK: No JVD; No carotid bruits LYMPHATICS: No lymphadenopathy CARDIAC: RRR, 1/6 holosystolic murmur at the left lower sternal border  no diastolic murmurs, rubs, gallops RESPIRATORY:  Clear to auscultation without rales, wheezing or rhonchi  ABDOMEN: Soft, non-tender, non-distended MUSCULOSKELETAL:  No edema; No deformity  SKIN: Warm and dry NEUROLOGIC:  Alert and oriented x 3 PSYCHIATRIC:  Normal affect   ASSESSMENT:    No diagnosis found. PLAN:    In order of problems listed above:  Syncope: Multiple possible arrhythmic etiologies are identified including high-grade AV block (although the longest pause identified was only 3.6 seconds and this occurred repeatedly at night), less likely ventricular arrhythmia, related to previous patch repair of the ventricular septal defect; possibly vasovagal response based on history described by his mother.  It is challenging to use the history as a tool  for establishing diagnosis due to his inability to communicate.  Evaluation for neurological causes for loss of consciousness has turned up negative so far.  Dr. Gwenlyn Found has referred him for a loop recorder, but has also requested EP consultation and he is scheduled for cardiac MRI on January 15.  Have tentatively scheduled him for loop recorder implantation on April 08, 2022.  If a firm diagnosis is established by that date, we can cancel the loop recorder implantation. History of VSD repair: No residual VSD seen on echo.  He does have right ventricular and right atrial enlargement, but this could well be due to severe tricuspid regurgitation. Severe TR: Could be related to a cleft tricuspid valve in the setting of endocardial cushion developmental abnormalities seen with trisomy 21.  Could also be due to untreated obstructive sleep apnea.  MRI will help clarify this. 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  Interestingly however his episodes of high-grade AV block appear to  consistently occur at night and he always has a narrow QRS complex.  This suggest that vagal influence/obstructive sleep apnea may be playing a role in the bradycardia arrhythmia.  It is quite possible that he will require pacemaker implantation nevertheless. OSA: Could be the cause for his right heart enlargement and tricuspid regurgitation.  Difficult to treat due to his inherent neuropsychological limitations.  He has had documented hypercapnia.     The loop recorder implantation procedure has been fully reviewed with the patient and with his mother/medical power of attorney and informed consent has been obtained.       Medication Adjustments/Labs and Tests Ordered: Current medicines are reviewed at length with the patient today.  Concerns regarding medicines are outlined above.  No orders of the defined types were placed in this encounter.  No orders of the defined types were placed in this encounter.   Patient Instructions  Medication Instructions:  Your physician recommends that you continue on your current medications as directed. Please refer to the Current Medication list given to you today.  *If you need a refill on your cardiac medications before your next appointment, please call your pharmacy*   Lab Work: NONE ordered at this time of appointment   If you have labs (blood work) drawn today and your tests are completely normal, you will receive your results only by: [place] (if you have MyChart) OR A paper copy in the mail If you have any lab test that is abnormal or we need to change your treatment, we will call you to review the results.  Testing/Procedures: Your physician has order a loop recorder implant  Follow-Up: At [facility], you and your health needs are our priority.  As part of our continuing mission to provide you with exceptional heart care, we have created designated Provider Care Teams.  These Care Teams include your primary  Cardiologist (physician) and Advanced Practice Providers (APPs -  Physician Assistants and Nurse Practitioners) who all work together to provide you with the care you need, when you need it.   Your next appointment:   2 week(s) post implant Virtual   Provider:   Dr. Sallyanne Kuster      Other Instructions     [facility] at [facility], [place]  [place], [place] [address]  Phone: [phone] Fax: [phone]   You are scheduled for a  Loop Recorder Implant on 04/08/22  with Dr. Sallyanne Kuster. This will take place at [place], [address].    Please arrive at your appointment 15-20 minutes early.   You do not need to be fasting.   The procedure is performed with local anesthesia. You will not receive sedatives nor will an IV be placed.   Wash your chest and neck with the surgical soap the evening before and the morning of your procedure. Please following the washing instructions provided.   As with all surgical implants, there is a small risk of infection. If an infection occurs, the device will be removed. To help reduce the risk, please use the surgical scrub provided. Additional antiseptic precautions will be taken at the time of the procedure.   Please bring your insurance cards.   You will be scheduled for a two week wound check visit with Dr. Sallyanne Kuster. This will be a video visit. If you are unable to do a video visit then you may come into the office.  *Please note that scheduled loop recorder implants may need to be rescheduled if Dr. Sallyanne Kuster has a procedure urgently added on at the hospital   Preparing for the Procedure  Before the procedure, you can play an important role. Because skin is not sterile, your skin needs to be as free of germs as possible. You can reduce the number of germs on your skin by washing with CHG (chlorhexidine gluconate) Soap before the procedure. CHG is an antiseptic cleaner which kills germs and bonds with the skin to continue  killing germs even after washing.  Please do not use if you have an allergy to CHG or antibacterial soaps. If your skin becomes reddened/irritated, STOP using the CHG.  DO NOT SHAVE (including legs and underarms) for at least 48 hours prior to first CHG shower. It is OK to shave your face.  Please follow these instructions carefully: Shower the night before the procedure and the morning of with CHG Soap. If you chose to wash your hair, wash your hair first as usual with your normal shampoo/conditioner. After you shampoo/condition, rinse you hair and body thoroughly to remove shampoo/conditioner. Use CHG as you would any other liquid soap. You can apply CHG directly to the skin and wash gently with a loofah or a clean washcloth. Apply the CHG Soap to your body ONLY FROM THE NECK DOWN. Do not use on open wounds or open sores. Avoid contact with your eyes, ears, mouth, and genitals (private parts).  Wash thoroughly, paying special attention to the area where your surgery will be performed. Thoroughly rinse your body with warm water from the neck down. DO NOT shower/wash with your normal soap after using and rinsing off the CHG Soap. Pat yourself dry with a clean towel. Wear clean pajamas to bed. Place clean sheets on your bed the night of your first shower and do not sleep with pets..  Day of Surgery: Shower with the CHG Soap following the instructions listed above. DO NOT apply deodorants or lotions.      Signed, Sanda Klein, MD  03/12/2022 9:23 AM    [place]

## 2022-03-15 ENCOUNTER — Ambulatory Visit (HOSPITAL_COMMUNITY)
Admission: RE | Admit: 2022-03-15 | Discharge: 2022-03-15 | Disposition: A | Discharge status: Home / Self Care | Payer: Medicare Other | Source: Ambulatory Visit | Attending: Cardiovascular Disease | Admitting: Cardiovascular Disease

## 2022-03-15 ENCOUNTER — Other Ambulatory Visit: Payer: Self-pay | Admitting: Cardiovascular Disease

## 2022-03-15 DIAGNOSIS — R55 Syncope and collapse: Secondary | ICD-10-CM | POA: Diagnosis not present

## 2022-03-15 DIAGNOSIS — G4733 Obstructive sleep apnea (adult) (pediatric): Secondary | ICD-10-CM

## 2022-03-15 DIAGNOSIS — I071 Rheumatic tricuspid insufficiency: Secondary | ICD-10-CM | POA: Diagnosis not present

## 2022-03-15 MED ORDER — GADOBUTROL 1 MMOL/ML IV SOLN
10.0000 mL | Freq: Once | INTRAVENOUS | Status: AC | PRN
  Administered 2022-03-15: 10 mL via INTRAVENOUS

## 2022-03-16 ENCOUNTER — Telehealth: Payer: Self-pay | Admitting: *Deleted

## 2022-03-16 NOTE — Progress Notes (Signed)
  Care Coordination Note  03/16/2022 Name: Jeffrey Jones MRN: [id number] DOB: February 21, 1991  Jeffrey Jones is a 32 y.o. year old male who is a primary care patient of Polite, Jori Moll, MD and is actively engaged with the care management team. I reached out to Jeffrey Jones by phone today to assist with re-scheduling an initial visit with the RN Case Manager  Follow up plan: Unsuccessful telephone outreach attempt made.   [place]  Direct Dial: [phone]

## 2022-03-19 NOTE — Progress Notes (Signed)
  Care Coordination Note  03/19/2022 Name: Jeffrey Jones MRN: [id number] DOB: 03/03/1990  Jeffrey Jones is a 32 y.o. year old male who is a primary care patient of Polite, Jori Moll, MD and is actively engaged with the care management team. I reached out to Jeffrey Jones by phone today to assist with re-scheduling an initial visit with the RN Case Manager  Follow up plan: Telephone appointment with care management team member scheduled for:03/24/22  [place]  Direct Dial: [phone]

## 2022-03-22 ENCOUNTER — Telehealth: Payer: Self-pay | Admitting: Cardiovascular Disease

## 2022-03-22 NOTE — Telephone Encounter (Signed)
Offing calling to request that pt's Cardiac MRI images be Power Shared. Please advise

## 2022-03-22 NOTE — Telephone Encounter (Signed)
Message sent to the MRI department and they are going to share the MRI through power share. Unable to reach office or leave a message they are closed.

## 2022-03-23 ENCOUNTER — Ambulatory Visit (HOSPITAL_COMMUNITY): Admission: RE | Admit: 2022-03-23 | Payer: Medicare Other | Source: Ambulatory Visit

## 2022-03-24 ENCOUNTER — Ambulatory Visit: Payer: Self-pay

## 2022-03-24 NOTE — Patient Outreach (Signed)
  Care Coordination   Initial Visit Note   03/24/2022 Name: JERIMIAH WOLMAN MRN: [id number] DOB: 28-Nov-1990  Festus Barren is a 32 y.o. year old male who sees Seward Carol, MD for primary care. I  Spoke with Mother Aldred Mase DPR  What matters to the patients health and wellness today?  Mother states that she is having difficulty coping with all of the various care issues for Ramie. States he is currently going to see cardiologists to try to figure out why he is having these spells.  States she needs help taking care of him as she does not have help.  States he does stay with his Father during the week but he also has health issues.  States she is concerned about his ears as his scan showed a deep impaction and she would like for him to see an ENT.    Goals Addressed             This Visit's Progress    COMPLETED: Care Coordination Activities -defers further RNCM follow up       Care Coordination Interventions: Advised patient to pt may need referral to ENT to have ears and hearing checked  Provided education to patient re: care coordination services,  Collaborated with Dr. Lina Sar office regarding requested referral to ENT for pt Social Work referral for caregiver stress,resources Assessed social determinant of health barriers Declines follow up with [facility] but will contact if needed and plans to work with Education officer, museum           SDOH assessments and interventions completed:  Yes  SDOH Interventions Today    Flowsheet Row Most Recent Value  SDOH Interventions   Food Insecurity Interventions Intervention Not Indicated  Housing Interventions Intervention Not Indicated  Transportation Interventions Other (Comment)  [had care guide referral]        Care Coordination Interventions:  Yes, provided   Follow up plan: Follow up call scheduled for LCSW 03/29/22    Encounter Outcome:  Pt. Visit Completed

## 2022-03-24 NOTE — Patient Instructions (Signed)
Visit Information  Thank you for taking time to visit with me today. Please don't hesitate to contact me if I can be of assistance to you.   Following are the goals we discussed today:   Goals Addressed             This Visit's Progress    COMPLETED: Care Coordination Activities -defers further RNCM follow up       Care Coordination Interventions: Advised patient to pt may need referral to ENT to have ears and hearing checked  Provided education to patient re: care coordination services,  Collaborated with Dr. Lina Sar office regarding requested referral to ENT for pt Social Work referral for caregiver stress,resources Assessed social determinant of health barriers Declines follow up with [facility] but will contact if needed and plans to work with Education officer, museum          Next appointment is by telephone on 03/29/22 at 1:15 PM  Please call the care guide team at [phone] if you need to cancel or reschedule your appointment.   If you are experiencing a Mental Health or [place] or need someone to talk to, please call the Suicide and Crisis Lifeline: 988 call the [place] National Suicide Prevention Lifeline: 5408655585 or TTY: 787 696 7121 TTY (304)652-8566) to talk to a trained counselor call 1-800-273-TALK (toll free, 24 hour hotline) go to [facility] [address] [place] [phone]) call 911   Patient verbalizes understanding of instructions and care plan provided today and agrees to view in [place]. Active MyChart status and patient understanding of how to access instructions and care plan via MyChart confirmed with patient.     Telephone follow up appointment with care management team member scheduled for: 03/29/22  Peter Garter RN, Jackquline [place], [place] [facility] [phone]

## 2022-03-25 DIAGNOSIS — Q210 Ventricular septal defect: Secondary | ICD-10-CM | POA: Diagnosis not present

## 2022-03-25 DIAGNOSIS — I340 Nonrheumatic mitral (valve) insufficiency: Secondary | ICD-10-CM | POA: Diagnosis not present

## 2022-03-25 DIAGNOSIS — I440 Atrioventricular block, first degree: Secondary | ICD-10-CM | POA: Diagnosis not present

## 2022-03-25 DIAGNOSIS — Z8774 Personal history of (corrected) congenital malformations of heart and circulatory system: Secondary | ICD-10-CM | POA: Diagnosis not present

## 2022-03-25 DIAGNOSIS — I517 Cardiomegaly: Secondary | ICD-10-CM | POA: Diagnosis not present

## 2022-03-25 DIAGNOSIS — I361 Nonrheumatic tricuspid (valve) insufficiency: Secondary | ICD-10-CM | POA: Diagnosis not present

## 2022-03-29 ENCOUNTER — Ambulatory Visit: Payer: Self-pay | Admitting: Licensed Clinical Social Worker

## 2022-03-29 NOTE — Patient Outreach (Signed)
  Care Coordination  Initial Visit Note   03/29/2022 Name: Jeffrey Jones MRN: [id number] DOB: [birth year]  Jeffrey Jones is a 32 y.o. year old male who sees Seward Carol, MD for primary care. I spoke with  Jeffrey Jones's mother by phone today.  What matters to the patients health and wellness today?    Patient's mother Jeffrey Jones  provided all information during this encounter. Mother/ Caregiver is experiencing difficulty with navigating system for patient and is overwhelmed with trying to figure out options for him. LCSW conducted brief assessment, recommendations and relevant information discussed.   Recommendation: Patient may benefit from, and mom is in agreement to reach out to [place] as a starting point for direction.  Will also explore group home options for patient.   Goals Addressed             This Visit's Progress    Care Coordination Activities Caregiver support       Care Coordination Interventions: Solution-Focused Strategies employed:  Emotional Support Provided Problem Solving /Task Center strategies reviewed Caregiver stress acknowledged :  Assessed need for caregiver  and current resources and support  Personal Care Services [facility]) :(unsure of provider) Discussed next steps and navigating current systems of care Reviewed and explained information caregiver received in the mail Discussed POA, HPOA and Guardianship  Mom will call [facility] manager Susanne Borders for direction [phone]        SDOH assessments and interventions completed:  No    Care Coordination Interventions:  Yes, provided   Follow up plan: Follow up call scheduled for 2/31/24    Encounter Outcome:  Pt. Visit Completed   Casimer Lanius, [place] [phone]

## 2022-03-29 NOTE — Patient Instructions (Signed)
Visit Information  Thank you for taking time to visit with me today. Please don't hesitate to contact me if I can be of assistance to you.   Following are the goals we discussed today:   Goals Addressed             This Visit's Progress    Care Coordination Activities Caregiver support       Care Coordination Interventions: Solution-Focused Strategies employed:  Emotional Support Provided Problem [place] strategies reviewed Caregiver stress acknowledged :  Assessed need for caregiver  and current resources and support  Personal Care Services [facility]) :(unsure of provider) Discussed next steps and navigating current systems of care Reviewed and explained information caregiver received in the mail Discussed POA, [place] and Marny [place]  Mom will call [facility] manager Susanne Borders for direction [phone]        Our next appointment is by telephone on 04/05/22 at 3:00  Please call the care guide team at [phone] if you need to cancel or reschedule your appointment.    Patient 's mother verbalizes understanding of instructions and care plan provided today and agrees to view in [place]. Active MyChart status and patient understanding of how to access instructions and care plan via MyChart confirmed with patient.      Casimer Lanius, [place] [phone]

## 2022-03-31 ENCOUNTER — Ambulatory Visit: Payer: Self-pay | Admitting: Licensed Clinical Social Worker

## 2022-03-31 NOTE — Patient Outreach (Signed)
  Care Coordination  Follow Up Visit Note   03/31/2022 Name: Jeffrey Jones MRN: [id number] DOB: 1990/09/30  Jeffrey Jones is a 32 y.o. year old male who sees Seward Carol, MD for primary care. I spoke with  Jeffrey Jones 's mother by phone today.  What matters to the patients health and wellness today?    Patient's mother Jeffrey Jones provided all information during this encounter. She continues to have difficulty with managing stress and navigating systems to provide needed support for patient .    Goals Addressed             This Visit's Progress    Reduce stress for caregiver and connect with resources to meet patient's needs       Activities in order to accomplish goals. Caregiver  ; Call [facility] to follow up on [place] options and group homes Review your EMMI educational information (Breathing to Relax and Managing Anxiety Around Daily Task) Look for an e-mail from [facility].  We will discuss Medicare insurance options at next visit         SDOH assessments and interventions completed:  No    Care Coordination Interventions:  Yes, provided  Interventions Today    Flowsheet Row Most Recent Value  General Interventions   General Interventions Discussed/Reviewed General Interventions Discussed, General Interventions Reviewed  Education Interventions   Education Provided Provided Verbal Education, Provided Web-based Education  [provided EMMI Education information]  Mental Health Interventions   Mental Health Discussed/Reviewed Coping Strategies, Mental Health Discussed  [Caregiver stress]       Follow up plan: Follow up call scheduled for 04/08/22    Encounter Outcome:  Pt. Visit Completed   Casimer Lanius, [place] [phone]

## 2022-03-31 NOTE — Patient Instructions (Signed)
Visit Information  Thank you for taking time to visit with me today. Please don't hesitate to contact me if I can be of assistance to you.   Following are the goals we discussed today:   Goals Addressed             This Visit's Progress    Reduce stress for caregiver and connect with resources to meet patient's needs       Activities in order to accomplish goals. Caregiver  ; Call [facility] to follow up on [place] options and group homes Review your EMMI educational information (Breathing to Relax and Managing Anxiety Around Daily Task) Look for an e-mail from [facility].  We will discuss Medicare insurance options at next visit         Our next appointment is by telephone on 04/08/22 at 3:30  Please call the care guide team at [phone] if you need to cancel or reschedule your appointment.   If you are experiencing a Mental Health or [place] or need someone to talk to, please call the Suicide and Crisis Lifeline: 988 call the [place] National Suicide Prevention Lifeline: 769-441-6201 or TTY: 919-875-1225 TTY (303)049-1302) to talk to a trained counselor call 1-800-273-TALK (toll free, 24 hour hotline)   Patient's mother verbalizes understanding of instructions and care plan provided today and agrees to view in [place]. Active MyChart status and patient understanding of how to access instructions and care plan via MyChart confirmed with patient.     Casimer Lanius, [place] [phone]

## 2022-04-07 ENCOUNTER — Telehealth: Payer: Self-pay | Admitting: Cardiovascular Disease

## 2022-04-07 NOTE — Telephone Encounter (Signed)
Pt mother called in stating pt has had some troubles getting his cpap set up.  She states she was told at his last OV she could make an appt to see sleep nurse here.

## 2022-04-08 ENCOUNTER — Ambulatory Visit: Payer: Medicare Other | Attending: Cardiovascular Disease | Admitting: Cardiovascular Disease

## 2022-04-08 ENCOUNTER — Ambulatory Visit: Payer: Self-pay | Admitting: Licensed Clinical Social Worker

## 2022-04-08 DIAGNOSIS — Z95818 Presence of other cardiac implants and grafts: Secondary | ICD-10-CM | POA: Diagnosis not present

## 2022-04-08 DIAGNOSIS — R55 Syncope and collapse: Secondary | ICD-10-CM | POA: Insufficient documentation

## 2022-04-08 MED ORDER — LIDOCAINE-EPINEPHRINE 1 %-1:100000 IJ SOLN
20.0000 mL | Freq: Once | INTRAMUSCULAR | Status: AC
  Administered 2022-04-08: 10 mL

## 2022-04-08 NOTE — Patient Instructions (Signed)
Discharge Instructions for  Loop Recorder Explant/Implant    Follow up: Keep your wound check appointment on Monday, 2/26 at 11:40 AM with Dr. Sallyanne Kuster (this is a virtual appointment).  If you have any questions or concerns, please call the office at [phone].  ACTIVITY No restrictions. DO wear your seatbelt, even if it crosses over the site.   WOUND CARE Keep the wound area clean and dry.  Remove the dressing the day after (usually 24 hours after the procedure). DO NOT SUBMERGE UNDER WATER UNTIL FULLY HEALED (no tub baths, hot tubs, swimming pools, etc.).  You  may shower or take a sponge bath after the dressing is removed. DO NOT SOAK the area and do not allow the shower to directly spray on the site. If you have tape/steri-strips on your wound, these will fall off; do not pull them off prematurely.   No bandage is needed on the site.  DO  NOT apply any creams, oils, or ointments to the wound area. If you notice any drainage or discharge from the wound, any swelling, excessive redness or bruising at the site, or if you develop a fever > 101? F, call the office at once at [phone].

## 2022-04-08 NOTE — Patient Instructions (Signed)
Visit Information  Thank you for taking time to visit with me today. Please don't hesitate to contact me if I can be of assistance to you.   Following are the goals we discussed today:   Goals Addressed             This Visit's Progress    Reduce stress for caregiver and connect with resources to meet patient's needs       Activities in order to accomplish goals. Caregiver  ; Call [facility] to follow up on Services (Personal Care options and group homes Review your EMMI educational information (Breathing to Relax and Managing Anxiety Around Daily Task) Look for an e-mail from [facility].  Call [facility] Medicare for information on enhanced benefits Meet with life span to discuss : Alternative living program, Respite Care and group living Follow up on dental resources provided          Our next appointment is by telephone on 05/13/22 at 3:30  Please call the care guide team at [phone] if you need to cancel or reschedule your appointment.    Patient verbalizes understanding of instructions and care plan provided today and agrees to view in [place]. Active MyChart status and patient understanding of how to access instructions and care plan via MyChart confirmed with patient.     Casimer Lanius, [place] [phone]

## 2022-04-08 NOTE — Progress Notes (Signed)
See recent office visit/H&P.  No interval change in medical issues.  Here for implantable loop recorder for recurrent syncope.   LOOP RECORDER IMPLANT   Procedure report  Procedure performed:  Loop recorder implantation   Reason for procedure:  Recurrent syncope/near-syncope  Procedure performed by:  Sanda Klein, MD  Complications:  None  Estimated blood loss:  <5 mL  Medications administered during procedure:  Lidocaine 1% with 1/100,000 epinephrine 10 mL locally Device details:  Medtronic Reveal Linq model number [id number], serial number [id number] Procedure details:  After the risks and benefits of the procedure were discussed the patient provided informed consent. The patient was prepped and draped in usual sterile fashion. Local anesthesia was administered to an area 2 cm to the left of the sternum in the 4th intercostal space. A cutaneous incision was made using the incision tool. The introducer was then used to create a subcutaneous tunnel and carefully deploy the device. Local pressure was held to ensure hemostasis.  The incision was closed with SteriStrips and a sterile dressing was applied.  R waves 0.56 mV  Sanda Klein, MD, [facility] [phone] office [phone] pager 04/08/2022 1:51 PM

## 2022-04-08 NOTE — Patient Outreach (Signed)
  Care Coordination  Follow Up Visit Note   04/08/2022 Name: ROLLAN ROGER MRN: [id number] DOB: August 30, 1990  Festus Barren is a 32 y.o. year old male who sees Seward Carol, MD for primary care. I spoke with  Theodosia Paling Braud's mother by phone today.  What matters to the patients health and wellness today?  Finding out what supportive services caregiver can get for patient.  She is his primary caregiver and is experiencing burn out.     Goals Addressed             This Visit's Progress    Reduce stress for caregiver and connect with resources to meet patient's needs       Activities in order to accomplish goals. Caregiver  ; Call [facility] to follow up on Services (Personal Care options and group homes Review your EMMI educational information (Breathing to Relax and Managing Anxiety Around Daily Task) Look for an e-mail from [facility].  Call [facility] Medicare for information on enhanced benefits Meet with life span to discuss : Alternative living program, Respite Care and group living Follow up on dental resources provided          SDOH assessments and interventions completed:  No   Care Coordination Interventions:  Yes, provided  Interventions Today    Flowsheet Row Most Recent Value  General Interventions   General Interventions Discussed/Reviewed General Interventions Discussed, Metallurgist, [facility], Group Home options and Respite Care]  Education Interventions   Education Provided Provided Verbal Education  Mental Health Interventions   Mental Health Discussed/Reviewed Coping Strategies  [for cargiver related to self-care]       Follow up plan: Follow up call scheduled for 05/13/22    Encounter Outcome:  Pt. Visit Completed   Casimer Lanius, [place] [phone]

## 2022-04-09 ENCOUNTER — Ambulatory Visit: Payer: Medicare Other | Attending: Cardiovascular Disease

## 2022-04-09 ENCOUNTER — Telehealth: Payer: Self-pay | Admitting: Cardiovascular Disease

## 2022-04-09 DIAGNOSIS — R55 Syncope and collapse: Secondary | ICD-10-CM

## 2022-04-09 NOTE — Telephone Encounter (Signed)
Pt scheduled for 1030

## 2022-04-09 NOTE — Progress Notes (Signed)
Patient seen due to mothers concern of amount of drainage on loop recorder dressing post implant on 04/08/21, clear tegaderm dressing intact, moderate amount of sanguinous drainage noted. No drainage noted coming out around the tegaderm site, informed mother this was a expected amount of drainage, sterile 4x4s provided in the event of possible drainage after removing the tegaderm.

## 2022-04-09 NOTE — Telephone Encounter (Signed)
    1. Has your device fired?   2. Is you device beeping?   3. Are you experiencing draining or swelling at device site? Dressing is saturated. Had ILR implanted yesterday by Dr. Loletha Grayer   4. Are you calling to see if we received your device transmission?   5. Have you passed out?     Please route to [place]   Pt's mother said, pt dressing is saturated and wants to come in this morning to the office to get it change. She's afraid that it will get infected. She would like to get a call back as soon as possible

## 2022-04-13 ENCOUNTER — Telehealth: Payer: Self-pay

## 2022-04-13 DIAGNOSIS — R55 Syncope and collapse: Secondary | ICD-10-CM

## 2022-04-13 NOTE — Telephone Encounter (Signed)
MDT alert for pause. EGM shows 3 sec pause on 04/10/22 @ 1651.  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  Presenting rhythm same. Routing for further review of pause episode (1st). - JJB  Pause occurs during daytime (4:51pm).  Mother says that he was getting ready to leave to go to a [employer], so he was very awake at that time.  No symptoms to report and otherwise he is doing okay.  Symptom episode correlates with time of implant when patient was in office, likely demonstrating use of symptom activator.  Will flag for Dr. Sallyanne Kuster for continued monitoring.

## 2022-04-14 NOTE — Telephone Encounter (Signed)
Since he was asymptomatic at the time, not a "smoking gun" episode, but I think it is very likely that Jeffrey Jones has had syncope to pauses like that. 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 LVEF is borderline low (50%). Probably best to proceed directly to a LBBB lead or a CRT device from the get-go. If Dr. Gwenlyn Found agrees, I think it is reasonable to make an EP appointment to discuss pacemaker implantation.

## 2022-04-14 NOTE — Telephone Encounter (Signed)
Lorretta Harp, MD  You; Croitoru, Dani Gobble, MD26 minutes ago (4:06 PM)    Absolutely!  Thanks [name].  I will have [place]  refer to EP.    Referral placed to EP- Dr. Lovena Le for consideration of pacemaker implantation per Dr. Gwenlyn Found.

## 2022-04-15 ENCOUNTER — Telehealth: Payer: Self-pay | Admitting: Cardiovascular Disease

## 2022-04-15 NOTE — Telephone Encounter (Signed)
Called and spoke with patients mother who was upset due to not knowing what referral to EP was about. Advised her of the below from Dr. Loletha Grayer and Dr. Gwenlyn Found. Patients mother aware and verbalized understanding. Consult is scheduled for 3/5 with Dr. Lovena Le.   Lorretta Harp, MD  to Sanda Klein, MD  Beatrix Fetters, RN    04/14/22  4:06 PM Absolutely!  Thanks [name].  I will have Jeffrey Jones  refer to EP. 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 LVEF is borderline low (50%). Probably best to proceed directly to a LBBB lead or a CRT device from the get-go. If Dr. Gwenlyn Found agrees, I think it is reasonable to make an EP appointment to discuss pacemaker implantation.

## 2022-04-15 NOTE — Telephone Encounter (Signed)
New message  Called and spoke w/pt mother about referral to EP. Pt mother states she was not told that pt needed to be seen by EP to discuss PPM and would like to speak to RN about referral

## 2022-04-21 ENCOUNTER — Telehealth: Payer: Self-pay | Admitting: *Deleted

## 2022-04-21 NOTE — Progress Notes (Signed)
  Care Coordination Note  04/21/2022 Name: Jeffrey Jones MRN: [id number] DOB: Jul 20, 1990  Jeffrey Jones is a 32 y.o. year old male who is a primary care patient of Polite, Jori Moll, MD and is actively engaged with the care management team. I reached out to Jeffrey Jones by phone today to assist with re-scheduling a follow up visit with the Licensed Clinical Social Worker  Follow up plan: Unsuccessful telephone outreach attempt made. A HIPAA compliant phone message was left for the patient providing contact information and requesting a return call.   [place]  Direct Dial: [phone]

## 2022-04-22 ENCOUNTER — Ambulatory Visit: Payer: Medicare Other | Attending: Cardiovascular Disease | Admitting: Cardiovascular Disease

## 2022-04-22 ENCOUNTER — Encounter: Payer: Self-pay | Admitting: Cardiovascular Disease

## 2022-04-22 VITALS — BP 128/69 | HR 50 | Ht 64.0 in | Wt 203.2 lb

## 2022-04-22 DIAGNOSIS — E6609 Other obesity due to excess calories: Secondary | ICD-10-CM | POA: Insufficient documentation

## 2022-04-22 DIAGNOSIS — Z6834 Body mass index (BMI) 34.0-34.9, adult: Secondary | ICD-10-CM | POA: Insufficient documentation

## 2022-04-22 DIAGNOSIS — I340 Nonrheumatic mitral (valve) insufficiency: Secondary | ICD-10-CM | POA: Diagnosis not present

## 2022-04-22 DIAGNOSIS — I071 Rheumatic tricuspid insufficiency: Secondary | ICD-10-CM

## 2022-04-22 DIAGNOSIS — Q909 Down syndrome, unspecified: Secondary | ICD-10-CM | POA: Diagnosis not present

## 2022-04-22 DIAGNOSIS — I441 Atrioventricular block, second degree: Secondary | ICD-10-CM | POA: Diagnosis not present

## 2022-04-22 DIAGNOSIS — E66811 Obesity, class 1: Secondary | ICD-10-CM

## 2022-04-22 DIAGNOSIS — G4733 Obstructive sleep apnea (adult) (pediatric): Secondary | ICD-10-CM | POA: Diagnosis not present

## 2022-04-22 DIAGNOSIS — R55 Syncope and collapse: Secondary | ICD-10-CM | POA: Diagnosis not present

## 2022-04-22 DIAGNOSIS — Q382 Macroglossia: Secondary | ICD-10-CM | POA: Diagnosis not present

## 2022-04-22 DIAGNOSIS — Z8774 Personal history of (corrected) congenital malformations of heart and circulatory system: Secondary | ICD-10-CM | POA: Diagnosis not present

## 2022-04-22 NOTE — Progress Notes (Signed)
Cardiology Office Note    Date:  04/26/2022   ID:  Jeffrey Jones, DOB 1990-11-27, MRN [id number]  PCP:  Seward Carol, MD  Cardiologist:  Shelva Majestic, MD (sleep); Dr. Gwenlyn Found  New sleep consultation    History of Present Illness:  Jeffrey Jones is a 32 y.o. male with Down syndrome trisomy 21 who is followed by Dr. Gwenlyn Found for cardiology care.  He has remote history of syncope and collapse event monitor showed 2-1 block.  He has seen Dr. Sallyanne Kuster and has a loop recorder implanted.  An echo Doppler study has shown severe TR, mild to moderate MR and normal biventricular function.  He underwent an MRI on March 15, 2022 which showed normal LV size and wall thickness with EF 51% with small apical aneurysm.  There was moderate RV dilatation with mild systolic dysfunction with EF 44%.  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  He was status post VSD repair.  There was moderate tricuspid regurgitation and mild mitral regurgitation.    He has a history of obstructive sleep apnea and previously had undergone sleep evaluation with Dr. Nehemiah Settle.  He had a nocturnal polysomnogram on May 20, 2017 at that time Epworth Sleepiness Scale score was elevated at 16.  He was found to have severe sleep apnea with an AHI overall at 30.4 events per hour.  He also had a central apnea index of 7.9 events per hour.  RAM AHI was severe at 75.2/h versus non-REM 8 AHI 23.5/h.  Events were worse with supine sleep with an AHI of 49.3/h.  He had significant oxygen desaturation down to a nadir of 70% during sleep.  Cumulative time under 88% saturation was 5.5 minutes.  Jeffrey Jones has been on CPAP therapy since his set up date on Jun 29, 2017.  [employer] is his DME company.  Currently, download was obtained today which showed data from Jul 02, 2017 through July 31, 2017.  At that time usage was 77% with average use at 6 hours and 35 minutes.  His pressure was set at a range of 4 to 16 cm  of water.  AHI was 0.7.  His 95th percentile pressure was 10.9 and maximum average pressure 12.0 cm of water.  Currently, he had only used CPAP for 6 months is not been on therapy since.  He still has his CPAP machine.  He is here with his mother and presents for sleep consultation and evaluation.   Past Medical History:  Diagnosis Date   Down's syndrome    Seizures ([place])     Past Surgical History:  Procedure Laterality Date   BACK SURGERY     REPAIR OF LEFT VENTRICLE LACERATION      Current Medications: Outpatient Medications Prior to Visit  Medication Sig Dispense Refill   Multiple Vitamin (MULTIVITAMIN) tablet Take 1 tablet by mouth daily.     terbinafine (LAMISIL) 250 MG tablet Take 1 tablet (250 mg total) by mouth daily. 90 tablet 0   fexofenadine (ALLEGRA ALLERGY) 60 MG tablet Take 60 mg by mouth 2 (two) times daily as needed. (Patient not taking: Reported on 04/22/2022)     fluticasone (FLONASE) 50 MCG/ACT nasal spray Place 1 spray into both nostrils daily. (Patient not taking: Reported on 04/22/2022)     ibuprofen (ADVIL,MOTRIN) 200 MG tablet Take 200 mg by mouth every 6 (six) hours as needed. (Patient not taking: Reported on 04/22/2022)     No facility-administered medications  prior to visit.     Allergies:   Patient has no active allergies.   Social History   Socioeconomic History   Marital status: Single    Spouse name: Not on file   Number of children: 0   Years of education: HS   Highest education level: Not on file  Occupational History   Occupation: N/A  Tobacco Use   Smoking status: Never   Smokeless tobacco: Never  Vaping Use   Vaping Use: Never used  Substance and Sexual Activity   Alcohol use: No   Drug use: No   Sexual activity: Not on file  Other Topics Concern   Not on file  Social History Narrative   Patient occasionally drinks soft drinks.   Patient is right handed but does other tasks with his left hand.          Social Determinants of  Health   Financial Resource Strain: Not on file  Food Insecurity: No Food Insecurity (03/24/2022)   Hunger Vital Sign    Worried About Running Out of Food in the Last Year: Never true    Ran Out of Food in the Last Year: Never true  Transportation Needs: Unmet Transportation Needs (03/24/2022)   PRAPARE - Hydrologist (Medical): No    Lack of Transportation (Non-Medical): Yes  Physical Activity: Not on file  Stress: Not on file  Social Connections: Not on file    Socially he was born in [place].  He currently lives with his mother.  However, his parents are divorced, and he was being shared by both parents at different locations, he would not use CPAP when he was at his father's dwelling.  He has 2 sisters.  No tobacco use  Family History:  The patient's family history includes Bell's palsy in his father; Diabetes in his father.   ROS General: Negative; No fevers, chills, or night sweats; Down syndrome HEENT: Negative; No changes in vision or hearing, sinus congestion, difficulty swallowing Pulmonary: Negative; No cough, wheezing, shortness of breath, hemoptysis Cardiovascular: Negative; No chest pain, presyncope, syncope, palpitations GI: Negative; No nausea, vomiting, diarrhea, or abdominal pain GU: Negative; No dysuria, hematuria, or difficulty voiding Musculoskeletal: Negative; no myalgias, joint pain, or weakness Hematologic/Oncology: Negative; no easy bruising, bleeding Endocrine: Negative; no heat/cold intolerance; no diabetes Neuro: Negative; no changes in balance, headaches Skin: Negative; No rashes or skin lesions Psychiatric: Negative; No behavioral problems, depression Sleep: See HPI. Other comprehensive 14 point system review is negative.   PHYSICAL EXAM:   VS:  BP 128/69 (BP Location: Left Arm, Patient Position: Sitting, Cuff Size: Normal)   Pulse (!) 50   Ht '5\' 4"'$  (1.626 m)   Wt 203 lb 3.2 oz (92.2 kg)   SpO2 97%   BMI 34.88 kg/m      Repeat blood pressure by me was 96/55 supine and 96/66 standing  Wt Readings from Last 3 Encounters:  04/22/22 203 lb 3.2 oz (92.2 kg)  03/10/22 203 lb 12.8 oz (92.4 kg)  02/23/22 211 lb (95.7 kg)    General: Alert, oriented, no distress.  Skin: normal turgor, no rashes, warm and dry HEENT: Normocephalic, atraumatic. Pupils equal round and reactive to light; sclera anicteric; extraocular muscles intact; Nose without nasal septal hypertrophy Mouth/Parynx: large tongue; narrowed aperture;Mallinpatti scale 2/3 Neck: Thick neck; no JVD, no carotid bruits; normal carotid upstroke Lungs: clear to ausculatation and percussion; no wheezing or rales Chest wall: without tenderness to palpitation Heart: PMI not  displaced, RRR, s1 s2 normal, 1/6 systolic murmur, no diastolic murmur, no rubs, gallops, thrills, or heaves Abdomen: soft, nontender; no hepatosplenomehaly, BS+; abdominal aorta nontender and not dilated by palpation. Back: no CVA tenderness Pulses 2+ Musculoskeletal: full range of motion, normal strength, no joint deformities Extremities: no clubbing cyanosis or edema, Homan's sign negative  Neurologic: grossly nonfocal; Cranial nerves grossly wnl Psychologic: Normal mood and affect   Studies/Labs Reviewed:   April 22, 2022 ECG (independently read by me) Sinus bradycardia at 59; 1st degree AV block; PR 400 msec  Recent Labs:    Latest Ref Rng & Units 02/04/2022   11:23 AM 08/11/2021    2:12 PM 06/10/2016   10:24 AM  BMP  Glucose 70 - 99 mg/dL 103  130  67   BUN 6 - 20 mg/dL '13  21  12   '$ Creatinine 0.76 - 1.27 mg/dL 1.18  1.38  1.18   BUN/Creat Ratio 9 - 20 11     Sodium 134 - 144 mmol/L 140  138  139   Potassium 3.5 - 5.2 mmol/L 4.5  4.2  4.2   Chloride 96 - 106 mmol/L 103  103  102   CO2 20 - 29 mmol/L '23  25  29   '$ Calcium 8.7 - 10.2 mg/dL 9.1  9.0  9.0         Latest Ref Rng & Units 12/22/2021    2:59 PM 04/08/2008    2:17 AM  Hepatic Function  Total Protein  6.0 - 8.5 g/dL 7.3  6.8   Albumin 4.3 - 5.2 g/dL 4.1  3.5   AST 0 - 40 IU/L 16  69   ALT 0 - 44 IU/L 11  26   Alk Phosphatase 44 - 121 IU/L 97  116   Total Bilirubin 0.0 - 1.2 mg/dL 0.6  0.7   Bilirubin, Direct 0.00 - 0.40 mg/dL 0.17         Latest Ref Rng & Units 08/11/2021    2:12 PM 06/10/2016   10:24 AM 01/05/2015   12:00 PM  CBC  WBC 4.0 - 10.5 K/uL 4.0  2.2  3.5   Hemoglobin 13.0 - 17.0 g/dL 17.1  16.9  17.7   Hematocrit 39.0 - 52.0 % 50.8  50.2  52.4   Platelets 150 - 400 K/uL 153  158  144    Lab Results  Component Value Date   MCV 96.8 08/11/2021   MCV 101.0 (H) 06/10/2016   MCV 100.7 (H) 01/05/2015   No results found for: "TSH" No results found for: "HGBA1C"   BNP    Component Value Date/Time   BNP 21.7 02/04/2022 1123    ProBNP No results found for: "PROBNP"   Lipid Panel  No results found for: "CHOL", "TRIG", "HDL", "CHOLHDL", "VLDL", "LDLCALC", "LDLDIRECT", "LABVLDL"   RADIOLOGY: No results found.   Additional studies/ records that were reviewed today include:   Reviewed the sleep study data interpreted by Dr. Nehemiah Settle on May 20, 2017.  A download was obtained and it appears his last use of CPAP was in 2019.   ASSESSMENT:    1. Obstructive sleep apnea   2. Macroglossia, congenital   3. Down syndrome   4. Syncope and collapse   5. Severe tricuspid regurgitation   6. Second degree AV block, Mobitz type II   7. Mild to moderate mitral insufficiency   8. S/P VSD repair   9. Class 1 obesity due to excess calories with  serious comorbidity and body mass index (BMI) of 34.0 to 34.9 in adult      PLAN:  Jeffrey Jones is a 32 year old gentleman with trisomy 28 who had recently experienced syncope with associated seizure-like activity.  He has been found to have 2-1 AV block with pauses up to 3-second and now has a loop recorder.  He is status post remote VSD repair at [facility] at age 57 months and is status post C1-2  surgery at [facility].  He was diagnosed with severe sleep apnea in 2019 and at that time he had an obstructive AHI of 30.4/h and central sleep apnea index of 7.9/h.  Sleep apnea was very severe during REM sleep with an AHI of 75.2 events per hour.  His parents are divorced.  When he would be with his mother he would use CPAP therapy but when he would go to his father's house apparently he did not use CPAP therapy.  Ultimately, he stopped using CPAP after approximately 6 months of use.  Presently, with his rhythm abnormalities, history of snoring, nonrestorative sleep, and excessive daytime sleepiness with a calculated Epworth scale score of 18, he was referred for sleep consultation and evaluation.  I had a lengthy discussion with both he and his mother today.  I discussed normal sleep architecture and the disruptive sleep architecture associated with obstructive sleep apnea.  I reviewed in detail the effect on cardiovascular health if patients have untreated sleep apnea with particular reference to its effects on hypertension, nocturnal arrhythmias with increased sympathetic tone contributing to palpitations as well as increased atrial fibrillation risk.  I discussed its negative effects on insulin resistance contributing to increased glucose, creased risk for nocturnal GERD, as well as potential increased inflammation.  In addition we discussed potential for nocturnal hypoxemia contributing to nocturnal ischemia in both cardiac as well as cerebrovascular circulation particularly if atherosclerosis is present.   I also discussed the pathophysiology associated with frequent nocturia seen in patients with untreated sleep apnea and the benefit of therapy.  Presently, he still has his old machine.  I discussed with him optimal sleep duration at 7 and 9 hours.  Typically he goes to bed at 10 PM and wakes up between 6 and 7.  However he can fall asleep anytime during the day and his Epworth scale today calculated  at 18.  His DME company is now [employer].  I will change his CPAP PAP settings from 4 to 16 cm to 7 to 18 cm.  I am also increasing his ramp start initiation to 6.  I provided him with an F30i  mask.  I answered all his mother's questions.  In 4 months for follow-up evaluation or sooner as needed.   Medication Adjustments/Labs and Tests Ordered: Current medicines are reviewed at length with the patient today.  Concerns regarding medicines are outlined above.  Medication changes, Labs and Tests ordered today are listed in the Patient Instructions below. Patient Instructions  Medication Instructions:  No changes  *If you need a refill on your cardiac medications before your next appointment, please call your pharmacy*   Lab Work: Not needed    Testing/Procedures:  Not needed  Follow-Up: At [facility], you and your health needs are our priority.  As part of our continuing mission to provide you with exceptional heart care, we have created designated Provider Care Teams.  These Care Teams include your primary Cardiologist (physician) and Advanced Practice Providers (APPs -  Physician  Assistants and Nurse Practitioners) who all work together to provide you with the care you need, when you need it.     Your next appointment:    3 to 4 month(s) Sleep   The format for your next appointment:   In Person  Provider:   Dr Shelva Majestic   Other Instructions  NO  pressure changes were done to your C-PAP - you were given a new mask F30I   Signed, Shelva Majestic, MD  04/26/2022 5:30 PM    [facility] [address] [place], [place], [place]  [address] Phone: [phone]

## 2022-04-22 NOTE — Patient Instructions (Signed)
Medication Instructions:  No changes  *If you need a refill on your cardiac medications before your next appointment, please call your pharmacy*   Lab Work: Not needed    Testing/Procedures:  Not needed  Follow-Up: At [facility], you and your health needs are our priority.  As part of our continuing mission to provide you with exceptional heart care, we have created designated Provider Care Teams.  These Care Teams include your primary Cardiologist (physician) and Advanced Practice Providers (APPs -  Physician Assistants and Nurse Practitioners) who all work together to provide you with the care you need, when you need it.     Your next appointment:    3 to 4 month(s) Sleep   The format for your next appointment:   In Person  Provider:   Dr Shelva Majestic   Other Instructions  NO  pressure changes were done to your C-PAP - you were given a new mask F30I

## 2022-04-26 ENCOUNTER — Ambulatory Visit: Payer: Medicare Other | Attending: Cardiovascular Disease | Admitting: Cardiovascular Disease

## 2022-04-26 ENCOUNTER — Encounter: Payer: Self-pay | Admitting: Cardiovascular Disease

## 2022-04-26 DIAGNOSIS — Z95818 Presence of other cardiac implants and grafts: Secondary | ICD-10-CM

## 2022-04-27 NOTE — Progress Notes (Signed)
  Care Coordination Note  04/27/2022 Name: DESMEN GOETTER MRN: [id number] DOB: August 02, 1990  Festus Barren is a 32 y.o. year old male who is a primary care patient of Polite, Jori Moll, MD and is actively engaged with the care management team. I reached out to Festus Barren by phone today to assist with scheduling a follow up visit with the Licensed Clinical Social Worker  Follow up plan: Telephone appointment with care management team member scheduled for:04/28/22  [place]: [phone]

## 2022-04-28 ENCOUNTER — Encounter: Payer: Self-pay | Admitting: Licensed Clinical Social Worker

## 2022-04-30 ENCOUNTER — Encounter: Payer: Self-pay | Admitting: Licensed Clinical Social Worker

## 2022-04-30 ENCOUNTER — Telehealth: Payer: Self-pay | Admitting: Licensed Clinical Social Worker

## 2022-04-30 NOTE — Patient Instructions (Signed)
    I am sorry you were unable to keep your phone appointment today.   The Care Guide will contact you to reschedule the phone appointment    Mialynn Shelvin, LCSW Social Work Care Coordination  [facility] [phone]  

## 2022-04-30 NOTE — Progress Notes (Signed)
Video visit for wound check after implantable loop recorder placement.  The site is well-healed without any redness, swelling, drainage or tenderness. The recorder has already demonstrated 2:1 AV block and possibly periods of third-degree AV block with pauses of approximately 3 seconds.  These were asymptomatic.  At baseline he has a very long PR interval of over 400 ms.  He has been scheduled to meet electrophysiology since he may eventually require pacemaker implantation, and may benefit from left bundle branch block pacing or cardiac resynchronization therapy in anticipation of frequent ventricular pacing, EF 50%.  Discussed these findings with his mother.  She would like to make sure that we monitor little longer before we make a decision regarding pacemaker therapy.

## 2022-04-30 NOTE — Patient Outreach (Signed)
  Care Coordination   04/30/2022 Name: Jeffrey Jones MRN: [id number] DOB: 09-Nov-1990   Care Coordination Outreach Attempts:  An unsuccessful telephone outreach was attempted for a scheduled appointment today.  Follow Up Plan:   Will route to care guide .  Additional outreach attempts will be made to offer the patient care coordination information and services.   Encounter Outcome:  No Answer   Care Coordination Interventions:  No, not indicated    Casimer Lanius, [place] [phone]

## 2022-05-04 ENCOUNTER — Encounter: Payer: Medicare Other | Admitting: Internal Medicine

## 2022-05-04 NOTE — Progress Notes (Deleted)
Cardiology Office Note:    Date:  05/04/2022   ID:  Festus Barren, DOB 1990/03/09, MRN [id number]  PCP:  Seward Carol, MD  Cardiologist:  Quay Burow, MD  Electrophysiologist:  None   Referring MD: Seward Carol, MD   Chief Complaint: follow-up of syncope  History of Present Illness:    Jeffrey Jones is a 32 y.o. 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 Gwenlyn Found and presents today for follow-up of recurrent syncope.   Patient was recently referred to Dr. Gwenlyn Found on 12/21/2021 for further evaluation of syncope. He has a history of VSD and underwent repair of this at [facility] when he was 33 months old. He has had recurrent syncope for the last 6-8 years. These events have been witnessed He usually has one syncopal episode a year but has had several this year. He reportedly has had an extensive Neurologic work-up. Prior EEG in [address] was normal. Per Neurology note in 10/2021, these episodes frequently happen in the bathroom. One episode occurred when his mother was squeezing a pimple. Neurology notes mentions "slight body quivering" and a few "myoclonic jerks" around these events but no clear seizure like activity.  Head CT in 07/2021 showed no acute intracranial abnormality. EEG was ordered at last visit with Neurology in 10/2021 and was reportedly unremarkable (I am unable to see the actual report from this). Dr. Gwenlyn Found ordered an Echo and 30 day Event Monitor to assess for any structural or arrhythmogenic cause. Of note, he also has a history of obstructive sleep apnea but does not use CPAP. Echo showed LVEF of 63% with indeterminate diastolic parameters, mildly enlarged RV with normal systolic function, severe right atrial  enlargement, mild to moderate MR, and severe TR. There was no evidence of VSD. Event monitor showed underlying sinus rhythm with intermittent episodes of high-grade AV block (mostly 2:1) that occurred at night with pauses in the 3 second range. He was referred to Dr. Sallyanne Kuster for consideration of implantable loop recorder. Cardiac MRI in 03/2022 showed normal LV size with LVEF of 51% and small apical aneurysm, moderate RV dilation with mildly reduced RVEF of 44%, mild MR, and moderate TR. There was no evidence of any residual shunt from prior VSD repair. He had loop recorder placed on 04/08/2022. Of note, it has been difficult to tease out the cause of his syncope due to patient's inability to communicate but possibly vasovagal in nature given history from mother.   Patient presents today for follow-up. ***  Recurrent Syncope Patient has a history of recurrent syncope dating back to 2016. Prior Neurologic work-up including head CT and EEG have been negative. He usually has 1-2 episodes per year but had multiple episodes within a couple of days earlier this year in June. Head CT in 07/2021 showed no acute intracranial abnormalities. He was seen by Neurology who ordered repeat EEG in 10/2021 which was reportedly unremarkable. It does not sound like he has had any recurrent episodes of syncope since 07/2021; however, patient's speech is limited and he is not able to fully communicate so unable to confirm this with patient. Recent Echo in 12/2021 showed LVEF of 63%, mildly enlarged RV with normal systolic function, severe right atrial enlargement, mild to  moderate MR, and severe TR. Some of his syncopal episodes sound vasovagal in nature. Event Monitor monitor in 01/2022 showed underlying sinus rhythm with intermittent episodes of high-grade AV block (mostly 2:1) at night with pauses in the 3 second range. It has been difficult to determine cause of syncope but possibly vasovagal given description of events from mother.  However, he underwent placement of implantable loop recorder on 04/08/2022. No recurrent events since then. ***  High-Grade AV Block Patient has a very long PR interval and had intermittent episodes of second degree AV block on monitor in 01/2022. Both can be seen with trisomy 21 and congential abnormalities of the development of the interventricular septum. However, episodes of high-grade AV block appear to consistently occur at night and he always has a narrow QRS complex which suggest that vagal influence/ obstructive sleep apnea may be playing a role in the bradycardia arrhyhtmia. He had a implantable loop recorder placed on 04/08/2022 so will continue to monitor on this. However, may require a pacemaker in the future.    Mild to Moderate Mitral Regurgitation Moderate Tricuspid Regurgitation Echo on 01/05/2022 showed severe tricuspid regurgitation with severe right atrial enlargement and mildly enlarged RV but RV systolic function is normal. Also showed mild to moderate mitral regurgitation. Cardiac MRI in 03/2022 showed normal LV size with LVEF of 51% and small apical aneurysm, moderate RV dilation with mildly reduced RVEF of 44%, mild MR, and moderate TR. 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 However, could also be due to untreated obstructive sleep apnea. Asymptomatic. Continue to monitor on routine outpatient Echos.    S/p VSD Repair S/p VSD repair at 27 months old. No evidence of residual shunt on cardiac MRI in 03/2022.   Obstructive Sleep Apnea History of OSA which could be the cause of his right heart enlargement and obstructive and tricuspid regurgitation. Continue CPAP. Followed by Dr. Claiborne Billings.  Past Medical History:  Diagnosis Date   Down's syndrome    Seizures ([place])     Past Surgical History:  Procedure Laterality Date   BACK SURGERY     REPAIR OF LEFT VENTRICLE LACERATION      Current  Medications: No outpatient medications have been marked as taking for the 05/18/22 encounter (Appointment) with Darreld Mclean, PA-C.     Allergies:   Patient has no active allergies.   Social History   Socioeconomic History   Marital status: Single    Spouse name: Not on file   Number of children: 0   Years of education: HS   Highest education level: Not on file  Occupational History   Occupation: N/A  Tobacco Use   Smoking status: Never   Smokeless tobacco: Never  Vaping Use   Vaping Use: Never used  Substance and Sexual Activity   Alcohol use: No   Drug use: No   Sexual activity: Not on file  Other Topics Concern   Not on file  Social History Narrative   Patient occasionally drinks soft drinks.   Patient is right handed but does other tasks with his left hand.          Social Determinants of Health   Financial Resource Strain: Not on file  Food Insecurity: No Food Insecurity (03/24/2022)   Hunger Vital Sign    Worried About Running Out of Food in the Last Year: Never true    Ran Out of Food in the Last Year:  Never true  Transportation Needs: Unmet Transportation Needs (03/24/2022)   PRAPARE - Hydrologist (Medical): No    Lack of Transportation (Non-Medical): Yes  Physical Activity: Not on file  Stress: Not on file  Social Connections: Not on file     Family History: The patient's family history includes Bell's palsy in his father; Diabetes in his father. There is no history of Seizures.  ROS:   Please see the history of present illness.     EKGs/Labs/Other Studies Reviewed:    The following studies were reviewed:  Echocardiogram 01/05/2022: Impressions:  1. Reported hx of VSD repair, no visible device appreciated. Left  ventricular ejection fraction by 3D volume is 63 %. The left ventricle has  normal function. The left ventricle has no regional wall motion  abnormalities. Left ventricular diastolic  parameters are  indeterminate.   2. Right ventricular systolic function is normal. The right ventricular  size is mildly enlarged. There is normal pulmonary artery systolic  pressure.   3. Right atrial size was severely dilated.   4. The mitral valve is normal in structure. Mild to moderate mitral valve  regurgitation. No evidence of mitral stenosis.   5. Tricuspid valve regurgitation is severe.   6. The aortic valve is normal in structure. Aortic valve regurgitation is  not visualized. No aortic stenosis is present.   7. The inferior vena cava is normal in size with greater than 50%  respiratory variability, suggesting right atrial pressure of 3 mmHg.  _______________  Event Monitor 02/05/2022 to 03/03/2022: SR/SB/ST 2:1 HB and third degree AVB with pauses in the 3 second range. Needs an appointment to see EP this week. _______________  Cardiac MRI 03/15/2022: Impressions: 1. Normal LV size, normal wall thickness, and low normal systolic function (EF [id number]). Small apical aneurysm 2.  Moderate RV dilatation with mild systolic dysfunction (EF [id number]) 3. Transmural late gadolinium enhancement at location of small LV apical aneurysm 4.  S/p VSD repair.  No evidence of residual shunt (Qp/Qs 1.0) 5.  Moderate tricuspid regurgitation (regurgitant fraction 34%) 6.  Mild mitral regurgitation (regurgitant fraction 10%)   EKG:  EKG not ordered today.   Recent Labs: 08/11/2021: Hemoglobin 17.1; Platelets 153 12/22/2021: ALT 11 02/04/2022: BNP 21.7; BUN 13; Creatinine, Ser 1.18; Potassium 4.5; Sodium 140  Recent Lipid Panel No results found for: "CHOL", "TRIG", "HDL", "CHOLHDL", "VLDL", "LDLCALC", "LDLDIRECT"  Physical Exam:    Vital Signs: There were no vitals taken for this visit.    Wt Readings from Last 3 Encounters:  04/22/22 203 lb 3.2 oz (92.2 kg)  03/10/22 203 lb 12.8 oz (92.4 kg)  02/23/22 211 lb (95.7 kg)     General: 32 y.o. male in no acute distress. HEENT: Normocephalic and atraumatic.  Sclera clear. EOMs intact. Neck: Supple. No carotid bruits. No JVD. Heart: *** RRR. Distinct S1 and S2. No murmurs, gallops, or rubs. Radial and distal pedal pulses 2+ and equal bilaterally. Lungs: No increased work of breathing. Clear to ausculation bilaterally. No wheezes, rhonchi, or rales.  Abdomen: Soft, non-distended, and non-tender to palpation. Bowel sounds present in all 4 quadrants.  MSK: Normal strength and tone for age. *** Extremities: No lower extremity edema.    Skin: Warm and dry. Neuro: Alert and oriented x3. No focal deficits. Psych: Normal affect. Responds appropriately.   Assessment:    No diagnosis found.  Plan:     Disposition: Follow up in ***   Medication Adjustments/Labs and Tests Ordered:  Current medicines are reviewed at length with the patient today.  Concerns regarding medicines are outlined above.  No orders of the defined types were placed in this encounter.  No orders of the defined types were placed in this encounter.   There are no Patient Instructions on file for this visit.   Signed, Darreld Mclean, PA-C  05/04/2022 9:48 AM    [facility]

## 2022-05-12 ENCOUNTER — Telehealth: Payer: Self-pay | Admitting: Licensed Clinical Social Worker

## 2022-05-12 ENCOUNTER — Encounter: Payer: Self-pay | Admitting: Licensed Clinical Social Worker

## 2022-05-12 NOTE — Patient Outreach (Signed)
  Care Coordination   05/12/2022 Name: VINCENZO STAVE MRN: [id number] DOB: 11-06-1990   Care Coordination Outreach Attempts:  An unsuccessful telephone outreach was attempted for a scheduled appointment today. ( 2nd attempt)  Follow Up Plan:  Additional outreach attempts will be made to offer the patient care coordination information and services.   Encounter Outcome:  No Answer   Care Coordination Interventions:  No, not indicated    Casimer Lanius, [place] [phone]

## 2022-05-12 NOTE — Patient Instructions (Addendum)
    I am sorry you were unable to keep your phone appointment today.   Please call me to reschedule  Elliott Quade, LCSW Social Work Care Coordination   /[facility] [phone]  

## 2022-05-13 ENCOUNTER — Encounter: Payer: Medicare Other | Admitting: Licensed Clinical Social Worker

## 2022-05-16 LAB — CUP PACEART REMOTE DEVICE CHECK
Date Time Interrogation Session: 20240316231817
Implantable Pulse Generator Implant Date: 20240208

## 2022-05-17 ENCOUNTER — Ambulatory Visit (INDEPENDENT_AMBULATORY_CARE_PROVIDER_SITE_OTHER): Payer: Medicare Other

## 2022-05-17 DIAGNOSIS — R55 Syncope and collapse: Secondary | ICD-10-CM | POA: Diagnosis not present

## 2022-05-18 ENCOUNTER — Telehealth: Payer: Self-pay | Admitting: Licensed Clinical Social Worker

## 2022-05-18 ENCOUNTER — Ambulatory Visit: Payer: Medicare Other | Admitting: Student

## 2022-05-18 NOTE — Patient Outreach (Signed)
  Care Coordination   05/18/2022 Name: Jeffrey Jones MRN: [id number] DOB: April 13, 1990   Care Coordination Outreach Attempts:  A third unsuccessful outreach was attempted today to offer the patient with information about available care coordination services as a benefit of their health plan. Left voice message for mother to call LCSW.  Follow Up Plan:  No further outreach attempts will be made at this time. We have been unable to contact the patient to continue care coordination services.  Will gladly assist as needed it return call is received.   Encounter Outcome:  No Answer   Care Coordination Interventions:  No, not indicated    Casimer Lanius, [place] [phone]

## 2022-05-18 NOTE — Patient Instructions (Signed)
  I am sorry I have not been able to reach you.  Please call if additional support is needed.  Care Coordination provides support specific to your health needs that extend beyond exceptional routine office care you already receive from your primary care doctor.    If you are eligible for standard Care Coordination, there is no cost to you.  The Care Coordination team is made up of the following team members: Registered Nurse Care Guide: disease management, health education, care coordination and complex case management Clinical Social Work: Complex Care Coordination including coordination of level of care needs, mental and behavioral health assessment and recommendations, and connection to long-term mental health support Clinical Pharmacist: medication management, assistance and disease management Community Resource Care Guides: Forensic psychologist Team: dedicated team of scheduling professionals to support patient and clinical team scheduling needs  Please call [phone] if you would like to schedule a phone appointment with one of the team members.   Casimer Lanius, [place] [phone]

## 2022-05-20 ENCOUNTER — Other Ambulatory Visit (HOSPITAL_COMMUNITY): Payer: Medicare Other

## 2022-05-27 ENCOUNTER — Encounter: Payer: Self-pay | Admitting: Internal Medicine

## 2022-05-27 ENCOUNTER — Ambulatory Visit: Payer: Medicare Other | Attending: Internal Medicine | Admitting: Internal Medicine

## 2022-05-27 VITALS — BP 120/70 | HR 68 | Ht 64.0 in | Wt 200.4 lb

## 2022-05-27 DIAGNOSIS — R55 Syncope and collapse: Secondary | ICD-10-CM | POA: Insufficient documentation

## 2022-05-27 DIAGNOSIS — I440 Atrioventricular block, first degree: Secondary | ICD-10-CM

## 2022-05-27 NOTE — Progress Notes (Signed)
HPI Jeffrey Jones is referred today for evaluation of bradycardia and syncope. He is a pleasant young man with Cherlyn Cushing' syndrome and a h/o VSD s/p repair as a child. He has had problems with recurrent syncope. He had an ILR placed and has had nocturnal high grade heart block. He has not passed out since his device was placed. His episodes of heart block with ventricular rates in the 20's have mostly occurred at night between 12 am and 6 am. He understands but does not speak much. He denies sob. His ECG shows NSR with marked first degree AV block. No Active Allergies   Current Outpatient Medications  Medication Sig Dispense Refill   fexofenadine (ALLEGRA ALLERGY) 60 MG tablet Take 60 mg by mouth 2 (two) times daily as needed.     fluticasone (FLONASE) 50 MCG/ACT nasal spray Place 1 spray into both nostrils daily.     ibuprofen (ADVIL,MOTRIN) 200 MG tablet Take 200 mg by mouth every 6 (six) hours as needed.     Multiple Vitamin (MULTIVITAMIN) tablet Take 1 tablet by mouth daily.     terbinafine (LAMISIL) 250 MG tablet Take 1 tablet (250 mg total) by mouth daily. 90 tablet 0   No current facility-administered medications for this visit.     Past Medical History:  Diagnosis Date   Down's syndrome    Seizures ([place])     ROS:   All systems reviewed and negative except as noted in the HPI.   Past Surgical History:  Procedure Laterality Date   BACK SURGERY     REPAIR OF LEFT VENTRICLE LACERATION       Family History  Problem Relation Age of Onset   Diabetes Father    Bell's palsy Father    Seizures Neg Hx      Social History   Socioeconomic History   Marital status: Single    Spouse name: Not on file   Number of children: 0   Years of education: HS   Highest education level: Not on file  Occupational History   Occupation: N/A  Tobacco Use   Smoking status: Never   Smokeless tobacco: Never  Vaping Use   Vaping Use: Never used  Substance and Sexual Activity    Alcohol use: No   Drug use: No   Sexual activity: Not on file  Other Topics Concern   Not on file  Social History Narrative   Patient occasionally drinks soft drinks.   Patient is right handed but does other tasks with his left hand.          Social Determinants of Health   Financial Resource Strain: Not on file  Food Insecurity: No Food Insecurity (03/24/2022)   Hunger Vital Sign    Worried About Running Out of Food in the Last Year: Never true    Ran Out of Food in the Last Year: Never true  Transportation Needs: Unmet Transportation Needs (03/24/2022)   PRAPARE - Hydrologist (Medical): No    Lack of Transportation (Non-Medical): Yes  Physical Activity: Not on file  Stress: Not on file  Social Connections: Not on file  Intimate Partner Violence: Not on file     BP 120/70   Pulse 68   Ht 5\' 4"  (1.626 m)   Wt 200 lb 6.4 oz (90.9 kg)   SpO2 98%   BMI 34.40 kg/m   Physical Exam:  Well appearing NAD HEENT: Unremarkable Neck:  No JVD,  no thyromegally Lymphatics:  No adenopathy Back:  No CVA tenderness Lungs:  Clear with no wheezes HEART:  Regular rate rhythm, no murmurs, no rubs, no clicks Abd:  soft, positive bowel sounds, no organomegally, no rebound, no guarding Ext:  2 plus pulses, no edema, no cyanosis, no clubbing Skin:  No rashes no nodules Neuro:  CN II through XII intact, motor grossly intact  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  Assess/Plan: Nocturnal heart block - with his prior VSD repair and marked first degree AV block, I have offered the patient DDD PM insertion but have admitted we have not yet correlated a long pause with syncope. His sister is with him today and would like to hold off on PPM for now. If the family changes there mind, pacing would be indicated. VSD - he is s/p repair and I do not hear any leak on exam.   Jeffrey Jones

## 2022-05-27 NOTE — Patient Instructions (Addendum)
Medication Instructions:  Your physician recommends that you continue on your current medications as directed. Please refer to the Current Medication list given to you today.  *If you need a refill on your cardiac medications before your next appointment, please call your pharmacy*  Lab Work: None ordered.  If you have labs (blood work) drawn today and your tests are completely normal, you will receive your results only by: [place] (if you have MyChart) OR A paper copy in the mail If you have any lab test that is abnormal or we need to change your treatment, we will call you to review the results.  Testing/Procedures: None ordered.  Follow-Up: At [facility], you and your health needs are our priority.  As part of our continuing mission to provide you with exceptional heart care, we have created designated Provider Care Teams.  These Care Teams include your primary Cardiologist (physician) and Advanced Practice Providers (APPs -  Physician Assistants and Nurse Practitioners) who all work together to provide you with the care you need, when you need it.   Your next appointment:   Follow up based on results of loop recorder, per Dr. Cristopher Peru  The format for your next appointment:   In Person  Provider:   Cristopher Peru, MD{or one of the following Advanced Practice Providers on your designated Care Team:   Tommye Standard, [place] "[facility]" [place], [place]

## 2022-06-21 ENCOUNTER — Ambulatory Visit (INDEPENDENT_AMBULATORY_CARE_PROVIDER_SITE_OTHER): Payer: Medicare Other

## 2022-06-21 DIAGNOSIS — R55 Syncope and collapse: Secondary | ICD-10-CM

## 2022-06-21 LAB — CUP PACEART REMOTE DEVICE CHECK
Date Time Interrogation Session: 20240421230613
Implantable Pulse Generator Implant Date: 20240208

## 2022-06-23 NOTE — Progress Notes (Signed)
Carelink Summary Report / Loop Recorder 

## 2022-07-06 ENCOUNTER — Telehealth: Payer: Self-pay | Admitting: Internal Medicine

## 2022-07-06 DIAGNOSIS — I440 Atrioventricular block, first degree: Secondary | ICD-10-CM

## 2022-07-06 NOTE — Telephone Encounter (Signed)
Mother to say that the patient is having episode  back to back. Calling to see what she needs to do. Please advise

## 2022-07-06 NOTE — Telephone Encounter (Signed)
Pt mother called back per message received by [facility] Triage.    Pt mother wanted to know if Dr. Ladona Ridgel reviewed the ILR transmission.  Pt mother advised Dr. Rachelle Hora Croitoru reviewed the last transmission, but Dr. Ladona Ridgel has not yet.  I will forward this to Dr. Ladona Ridgel for review, and follow up with him in Thursdays clinic.    Pt mother advised we will be reaching out soon.

## 2022-07-08 ENCOUNTER — Encounter: Payer: Medicare Other | Admitting: Internal Medicine

## 2022-07-08 ENCOUNTER — Telehealth: Payer: Self-pay

## 2022-07-08 NOTE — Telephone Encounter (Signed)
He has known pauses and brady. I offered him PPM at his last visit and his sister was not inclined to pursue PPM. If the family would like to pursue PPM, this can be accomplished. GT

## 2022-07-08 NOTE — Telephone Encounter (Signed)
Following alert received from [employer] received for a 6 second pause and 8 brady episodes that show second degree heart block, sent to triage.  Spoke to patients mother Rosey Bath, states patient is non-verbal bu denies any known symptoms. Advised I will review with Dr. Ladona Ridgel but anticipate no changes. Voiced understanding.

## 2022-07-12 NOTE — Telephone Encounter (Signed)
Attempted to follow up with pt. No answer, LMTCB.

## 2022-07-12 NOTE — Telephone Encounter (Signed)
Patients mother would like for appointment with Dr. Ladona Ridgel to discuss a pacemaker further.

## 2022-07-16 NOTE — Telephone Encounter (Signed)
Patient's mother is calling because she would like to know if they can go ahead with the procedure to get the pacemaker instead of consulting about it on 06/14 and then having the procedure. Please advise.

## 2022-07-16 NOTE — Telephone Encounter (Signed)
Spoke with pt's mother, DPR and advised pt will need an OV prior to scheduling PPM implant to complete updating H&P, EKG and labs as pt last seen 04/2022.  Pt's mother advised RN will forward request to Dr Lubertha Basque nurse and scheduler to have pt placed on cancellation list for Dr Ladona Ridgel.  Will continue to monitor loop recorder.  Pt's mother verbalizes understanding and thanked Charity fundraiser for the call.

## 2022-07-22 NOTE — Progress Notes (Signed)
Carelink Summary Report / Loop Recorder 

## 2022-07-23 NOTE — Addendum Note (Signed)
Addended by: Cleda Mccreedy on: 07/23/2022 11:35 AM   Modules accepted: Orders

## 2022-07-23 NOTE — Telephone Encounter (Signed)
Pt's Mother states that he does not need to see Dr. Ladona Ridgel prior to the procedure.   She will get the soap scrub while here for labs.

## 2022-07-23 NOTE — Telephone Encounter (Signed)
Appt to see GT on 6/14 has been cancelled - Pt's Mother aware.

## 2022-07-23 NOTE — Telephone Encounter (Signed)
Pt is scheduled for a Pacemaker Implant with Dr. Ladona Ridgel on 08/25/22...  He will come in for labs on 6/14 and get Instruction letter and scrub at that time.   I have called his Mother and Left a message to call me back. Per Dr. Ladona Ridgel he does not need to see the pt again in clinic prior to procedure unless they have questions/concerns. When I hear back from her I will cancel the o/v.

## 2022-07-23 NOTE — Telephone Encounter (Signed)
Patient's mother states she is returning a call from April.

## 2022-07-27 ENCOUNTER — Ambulatory Visit (INDEPENDENT_AMBULATORY_CARE_PROVIDER_SITE_OTHER): Payer: Medicare Other

## 2022-07-27 DIAGNOSIS — R55 Syncope and collapse: Secondary | ICD-10-CM

## 2022-07-27 LAB — CUP PACEART REMOTE DEVICE CHECK
Date Time Interrogation Session: 20240524230357
Implantable Pulse Generator Implant Date: 20240208

## 2022-08-11 ENCOUNTER — Ambulatory Visit: Payer: Medicare Other | Attending: Internal Medicine

## 2022-08-11 DIAGNOSIS — I440 Atrioventricular block, first degree: Secondary | ICD-10-CM

## 2022-08-12 LAB — CBC
Hematocrit: 46 % (ref 37.5–51.0)
Hemoglobin: 16 g/dL (ref 13.0–17.7)
MCH: 32.8 pg (ref 26.6–33.0)
MCHC: 34.8 g/dL (ref 31.5–35.7)
MCV: 94 fL (ref 79–97)
Platelets: 155 10*3/uL (ref 150–450)
RBC: 4.88 x10E6/uL (ref 4.14–5.80)
RDW: 12.9 % (ref 11.6–15.4)
WBC: 3 10*3/uL — ABNORMAL LOW (ref 3.4–10.8)

## 2022-08-12 LAB — BASIC METABOLIC PANEL
BUN/Creatinine Ratio: 12 (ref 9–20)
BUN: 16 mg/dL (ref 6–20)
CO2: 22 mmol/L (ref 20–29)
Calcium: 9.1 mg/dL (ref 8.7–10.2)
Chloride: 102 mmol/L (ref 96–106)
Creatinine, Ser: 1.34 mg/dL — ABNORMAL HIGH (ref 0.76–1.27)
Glucose: 94 mg/dL (ref 70–99)
Potassium: 4.6 mmol/L (ref 3.5–5.2)
Sodium: 143 mmol/L (ref 134–144)
eGFR: 73 mL/min/{1.73_m2} (ref >59)

## 2022-08-13 ENCOUNTER — Ambulatory Visit: Payer: Medicare Other

## 2022-08-13 ENCOUNTER — Ambulatory Visit: Payer: Medicare Other | Admitting: Internal Medicine

## 2022-08-18 ENCOUNTER — Ambulatory Visit: Payer: Medicare Other | Admitting: Cardiovascular Disease

## 2022-08-18 NOTE — Progress Notes (Signed)
Carelink Summary Report / Loop Recorder 

## 2022-08-20 ENCOUNTER — Ambulatory Visit: Payer: Medicare Other | Admitting: Cardiovascular Disease

## 2022-08-24 NOTE — Pre-Procedure Instructions (Signed)
Attempted to call patient.  No answer.

## 2022-08-25 ENCOUNTER — Ambulatory Visit (HOSPITAL_COMMUNITY): Payer: Medicare Other

## 2022-08-25 ENCOUNTER — Ambulatory Visit: Payer: Medicare Other | Admitting: Cardiovascular Disease

## 2022-08-25 ENCOUNTER — Encounter (HOSPITAL_COMMUNITY)
Admission: RE | Disposition: A | Discharge status: Home / Self Care | Payer: Self-pay | Source: Home / Self Care | Attending: Internal Medicine

## 2022-08-25 ENCOUNTER — Ambulatory Visit (HOSPITAL_COMMUNITY)
Admission: RE | Admit: 2022-08-25 | Discharge: 2022-08-25 | Disposition: A | Discharge status: Home / Self Care | Payer: Medicare Other | Attending: Internal Medicine | Admitting: Internal Medicine

## 2022-08-25 DIAGNOSIS — Q210 Ventricular septal defect: Secondary | ICD-10-CM | POA: Diagnosis not present

## 2022-08-25 DIAGNOSIS — Q909 Down syndrome, unspecified: Secondary | ICD-10-CM | POA: Insufficient documentation

## 2022-08-25 DIAGNOSIS — R001 Bradycardia, unspecified: Secondary | ICD-10-CM | POA: Diagnosis not present

## 2022-08-25 DIAGNOSIS — I517 Cardiomegaly: Secondary | ICD-10-CM | POA: Diagnosis not present

## 2022-08-25 DIAGNOSIS — I442 Atrioventricular block, complete: Secondary | ICD-10-CM | POA: Diagnosis not present

## 2022-08-25 DIAGNOSIS — Z950 Presence of cardiac pacemaker: Secondary | ICD-10-CM | POA: Diagnosis not present

## 2022-08-25 HISTORY — PX: PACEMAKER IMPLANT: EP1218

## 2022-08-25 HISTORY — PX: LOOP RECORDER REMOVAL: EP1215

## 2022-08-25 SURGERY — PACEMAKER IMPLANT
Anesthesia: LOCAL

## 2022-08-25 MED ORDER — POVIDONE-IODINE 10 % EX SWAB
2.0000 | Freq: Once | CUTANEOUS | Status: AC
  Administered 2022-08-25: 2 via TOPICAL

## 2022-08-25 MED ORDER — ONDANSETRON HCL 4 MG/2ML IJ SOLN: 4.0000 mg | Freq: Four times a day (QID) | INTRAMUSCULAR | Status: DC | PRN

## 2022-08-25 MED ORDER — CHLORHEXIDINE GLUCONATE 4 % EX SOLN: 4.0000 | Freq: Once | CUTANEOUS | Status: DC

## 2022-08-25 MED ORDER — SODIUM CHLORIDE 0.9 % IV SOLN: INTRAVENOUS | Status: DC

## 2022-08-25 MED ORDER — CEFAZOLIN SODIUM-DEXTROSE 2-4 GM/100ML-% IV SOLN: 2.0000 g | INTRAVENOUS | Status: DC

## 2022-08-25 MED ORDER — CEFAZOLIN SODIUM-DEXTROSE 1-4 GM/50ML-% IV SOLN: 1.0000 g | Freq: Once | INTRAVENOUS | Status: DC

## 2022-08-25 MED ORDER — MIDAZOLAM HCL 5 MG/5ML IJ SOLN
INTRAMUSCULAR | Status: DC | PRN
  Administered 2022-08-25 (×3): 1 mg via INTRAVENOUS

## 2022-08-25 MED ORDER — FENTANYL CITRATE (PF) 100 MCG/2ML IJ SOLN
INTRAMUSCULAR | Status: AC
  Filled 2022-08-25: qty 2

## 2022-08-25 MED ORDER — LIDOCAINE HCL (PF) 1 % IJ SOLN
INTRAMUSCULAR | Status: DC | PRN
  Administered 2022-08-25: 80 mL

## 2022-08-25 MED ORDER — ACETAMINOPHEN 325 MG PO TABS
325.0000 mg | ORAL_TABLET | ORAL | Status: DC | PRN
  Administered 2022-08-25 (×2): 650 mg via ORAL
  Filled 2022-08-25 (×2): qty 2

## 2022-08-25 MED ORDER — MIDAZOLAM HCL 5 MG/5ML IJ SOLN
INTRAMUSCULAR | Status: AC
  Filled 2022-08-25: qty 5

## 2022-08-25 MED ORDER — CEFAZOLIN SODIUM-DEXTROSE 2-4 GM/100ML-% IV SOLN
INTRAVENOUS | Status: AC
  Filled 2022-08-25: qty 100

## 2022-08-25 MED ORDER — LIDOCAINE HCL 1 % IJ SOLN
INTRAMUSCULAR | Status: AC
  Filled 2022-08-25: qty 60

## 2022-08-25 MED ORDER — HEPARIN (PORCINE) IN NACL 1000-0.9 UT/500ML-% IV SOLN
INTRAVENOUS | Status: DC | PRN
  Administered 2022-08-25: 500 mL

## 2022-08-25 MED ORDER — SODIUM CHLORIDE 0.9 % IV SOLN
80.0000 mg | INTRAVENOUS | Status: AC
  Administered 2022-08-25: 80 mg

## 2022-08-25 MED ORDER — FENTANYL CITRATE (PF) 100 MCG/2ML IJ SOLN
INTRAMUSCULAR | Status: DC | PRN
  Administered 2022-08-25 (×3): 12.5 ug via INTRAVENOUS

## 2022-08-25 MED ORDER — SODIUM CHLORIDE 0.9 % IV SOLN
INTRAVENOUS | Status: AC
  Filled 2022-08-25: qty 2

## 2022-08-25 SURGICAL SUPPLY — 12 items
CABLE SURGICAL S-101-97-12 (CABLE) ×1 IMPLANT
CATH CPS LOCATOR 3D MED (CATHETERS) IMPLANT
KIT MICROPUNCTURE NIT STIFF (SHEATH) IMPLANT
LEAD ULTIPACE 52 LPA1231/52 (Lead) IMPLANT
LEAD ULTIPACE 65 LPA1231/65 (Lead) IMPLANT
PACEMAKER ASSURITY DR-RF (Pacemaker) IMPLANT
PAD DEFIB RADIO PHYSIO CONN (PAD) ×1 IMPLANT
SHEATH 7FR PRELUDE SNAP 13 (SHEATH) IMPLANT
SHEATH 9FR PRELUDE SNAP 13 (SHEATH) IMPLANT
SLITTER AGILIS HISPRO (INSTRUMENTS) IMPLANT
TRAY PACEMAKER INSERTION (PACKS) ×1 IMPLANT
WIRE HI TORQ VERSACORE-J 145CM (WIRE) IMPLANT

## 2022-08-25 NOTE — Discharge Instructions (Addendum)
After Your Pacemaker   You have a [employer] Pacemaker  ACTIVITY Do not lift your arm above shoulder height for 1 week after your procedure. After 7 days, you may progress as below.  You should remove your sling 24 hours after your procedure, unless otherwise instructed by your provider.     Wednesday September 01, 2022  Thursday September 02, 2022 Friday September 03, 2022 Saturday September 04, 2022   Do not lift, push, pull, or carry anything over 10 pounds with the affected arm until 6 weeks (Wednesday October 06, 2022 ) after your procedure.   You may drive AFTER your wound check, unless you have been told otherwise by your provider.   Ask your healthcare provider when you can go back to work   INCISION/Dressing  If large square, outer bandage is left in place, this can be removed after 24 hours from your procedure. Do not remove steri-strips or glue as below.   If a PRESSURE DRESSING (a bulky dressing that usually goes up over your shoulder) was applied or left in place, please follow instructions given by your provider on when to return to have this removed.   Monitor your Pacemaker site for redness, swelling, and drainage. Call the device clinic at [phone] if you experience these symptoms or fever/chills.  If your incision is sealed with Steri-strips or staples, you may shower 7 days after your procedure or when told by your provider. Do not remove the steri-strips or let the shower hit directly on your site. You may wash around your site with soap and water.    If you were discharged in a sling, please do not wear this during the day more than 48 hours after your surgery unless otherwise instructed. This may increase the risk of stiffness and soreness in your shoulder.   Avoid lotions, ointments, or perfumes over your incision until it is well-healed.  You may use a hot tub or a pool AFTER your wound check appointment if the incision is completely closed.  Pacemaker Alerts:  Some alerts are  vibratory and others beep. These are NOT emergencies. Please call our office to let us know. If this occurs at night or on weekends, it can wait until the next business day. Send a remote transmission.  If your device is capable of reading fluid status (for heart failure), you will be offered monthly monitoring to review this with you.   DEVICE MANAGEMENT Remote monitoring is used to monitor your pacemaker from home. This monitoring is scheduled every 91 days by our office. It allows [place] to keep an eye on the functioning of your device to ensure it is working properly. You will routinely see your Electrophysiologist annually (more often if necessary).   You should receive your ID card for your new device in 4-8 weeks. Keep this card with you at all times once received. Consider wearing a medical alert bracelet or necklace.  Your Pacemaker may be MRI compatible. This will be discussed at your next office visit/wound check.  You should avoid contact with strong electric or magnetic fields.   Do not use amateur (ham) radio equipment or electric (arc) welding torches. MP3 player headphones with magnets should not be used. Some devices are safe to use if held at least 12 inches (30 cm) from your Pacemaker. These include power tools, lawn mowers, and speakers. If you are unsure if something is safe to use, ask your health care provider.  When using your cell phone, hold it  to the ear that is on the opposite side from the Pacemaker. Do not leave your cell phone in a pocket over the Pacemaker.  You may safely use electric blankets, heating pads, computers, and microwave ovens.  Call the office right away if: You have chest pain. You feel more short of breath than you have felt before. You feel more light-headed than you have felt before. Your incision starts to open up.  This information is not intended to replace advice given to you by your health care provider. Make sure you discuss any questions you  have with your health care provider.

## 2022-08-25 NOTE — H&P (Signed)
HPI Mr. Ullom is referred today for evaluation of bradycardia and syncope. 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 He has had problems with recurrent syncope. He had an ILR placed and has had nocturnal high grade heart block. He has not passed out since his device was placed. His episodes of heart block with ventricular rates in the 20's have mostly occurred at night between 12 am and 6 am. He understands but does not speak much. He denies sob. His ECG shows NSR with marked first degree AV block. No Active Allergies           Current Outpatient Medications  Medication Sig Dispense Refill   fexofenadine (ALLEGRA ALLERGY) 60 MG tablet Take 60 mg by mouth 2 (two) times daily as needed.       fluticasone (FLONASE) 50 MCG/ACT nasal spray Place 1 spray into both nostrils daily.       ibuprofen (ADVIL,MOTRIN) 200 MG tablet Take 200 mg by mouth every 6 (six) hours as needed.       Multiple Vitamin (MULTIVITAMIN) tablet Take 1 tablet by mouth daily.       terbinafine (LAMISIL) 250 MG tablet Take 1 tablet (250 mg total) by mouth daily. 90 tablet 0    No current facility-administered medications for this visit.            Past Medical History:  Diagnosis Date   Down's syndrome     Seizures (HCC)        ROS:    All systems reviewed and negative except as noted in the HPI.          Past Surgical History:  Procedure Laterality Date   BACK SURGERY       REPAIR OF LEFT VENTRICLE LACERATION                 Family History  Problem Relation Age of Onset   Diabetes Father     Bell's palsy Father     Seizures Neg Hx          Social History         Socioeconomic History   Marital status: Single      Spouse name: Not on file   Number of children: 0   Years of education: HS   Highest education level: Not on file  Occupational History   Occupation: N/A  Tobacco Use   Smoking status: Never   Smokeless tobacco: Never   Vaping Use   Vaping Use: Never used  Substance and Sexual Activity   Alcohol use: No   Drug use: No   Sexual activity: Not on file  Other Topics Concern   Not on file  Social History Narrative    Patient occasionally drinks soft drinks.    Patient is right handed but does other tasks with his left hand.               Social Determinants of Health        Financial Resource Strain: Not on file  Food Insecurity: No Food Insecurity (03/24/2022)    Hunger Vital Sign     Worried About Running Out of Food in the Last Year: Never true     Ran Out of Food in the Last Year: Never true  Transportation Needs: Unmet Transportation Needs (03/24/2022)    PRAPARE - Transportation     Lack of Transportation (  Medical): No     Lack of Transportation (Non-Medical): Yes  Physical Activity: Not on file  Stress: Not on file  Social Connections: Not on file  Intimate Partner Violence: Not on file        BP 120/70   Pulse 68   Ht 5\' 4"  (1.626 m)   Wt 200 lb 6.4 oz (90.9 kg)   SpO2 98%   BMI 34.40 kg/m    Physical Exam:   Well appearing NAD HEENT: Unremarkable Neck:  No JVD, no thyromegally Lymphatics:  No adenopathy Back:  No CVA tenderness Lungs:  Clear with no wheezes HEART:  Regular rate rhythm, no murmurs, no rubs, no clicks Abd:  soft, positive bowel sounds, no organomegally, no rebound, no guarding Ext:  2 plus pulses, no edema, no cyanosis, no clubbing Skin:  No rashes no nodules Neuro:  CN II through XII intact, motor grossly intact   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   Assess/Plan: Nocturnal heart block - with his prior VSD repair and marked first degree AV block, I have offered the patient DDD PM insertion but have admitted we have not yet correlated a long pause with syncope. His sister is with him today and would like to hold off on PPM for now. If the family changes there mind, pacing would be indicated. VSD - he is s/p repair and I do not  hear any leak on exam.    Dorathy Daft  EP Attending  Patient seen and examined. Since his last visit, he has continued to have pauses and is symptomatic. He will undergo PPM due to intermittent CHB. I have reviewed the indications risks/benefits/goals/expectations of the procedure and he wishes to proceed.   Sharlot Gowda Simmone Cape,MD

## 2022-08-26 ENCOUNTER — Encounter: Payer: Self-pay | Admitting: Cardiovascular Disease

## 2022-08-26 ENCOUNTER — Encounter (HOSPITAL_COMMUNITY): Payer: Self-pay | Admitting: Internal Medicine

## 2022-08-26 ENCOUNTER — Telehealth: Payer: Self-pay | Admitting: Internal Medicine

## 2022-08-26 NOTE — Telephone Encounter (Signed)
Error

## 2022-08-26 NOTE — Telephone Encounter (Signed)
I let the patient mother speak with Leigh, rn.

## 2022-08-26 NOTE — Telephone Encounter (Signed)
Patient's mother is calling to state that patient may have dislodged his device around 4:30 A.M. after having the procedure yesterday. 06/26 States patient is settled now, but at the time experienced low BP and does seem to have drainage. Transferred to device clinic.

## 2022-08-26 NOTE — Telephone Encounter (Signed)
Pt has hx of down syndrome and mostly on-verbal per mother. Patient able to answer "yes " or "no" to questions.  Mother called in reporting she found patient this morning at 0400 sitting on the commode with his sling removed. States patient was not able to get off the commode, so her and husband took a sheet to move patient to the bed. States patient was alert the entire time and cool skin to touch. No diaphoresis noted. Patient also is not diabetic. States she checked patient BP ~ 86/60. Reports now patient is back to his normal self and BP currently is 100/60 which was normal for patient (also same while in hospital). Patient has sine walked around and denies any current pain or not feeling well.   Reviewed with Dr. Ladona Ridgel who advises patient to drink plenty of fluids today and if BP drops again like prior to go to ED for evaluation. Mother advised and voiced understanding/agreeable to plan.

## 2022-08-27 NOTE — Telephone Encounter (Signed)
Already had reviewed education with patients mother 08/22/25.

## 2022-09-08 ENCOUNTER — Ambulatory Visit: Payer: Medicare Other

## 2022-09-09 ENCOUNTER — Ambulatory Visit: Payer: Medicare Other

## 2022-09-11 ENCOUNTER — Emergency Department (HOSPITAL_COMMUNITY): Payer: Medicare Other

## 2022-09-11 ENCOUNTER — Encounter (HOSPITAL_COMMUNITY): Payer: Self-pay | Admitting: *Deleted

## 2022-09-11 ENCOUNTER — Emergency Department (HOSPITAL_COMMUNITY)
Admission: EM | Admit: 2022-09-11 | Discharge: 2022-09-11 | Disposition: A | Discharge status: Home / Self Care | Payer: Medicare Other | Attending: Emergency Medicine | Admitting: Emergency Medicine

## 2022-09-11 ENCOUNTER — Other Ambulatory Visit: Payer: Self-pay

## 2022-09-11 DIAGNOSIS — I959 Hypotension, unspecified: Secondary | ICD-10-CM | POA: Insufficient documentation

## 2022-09-11 DIAGNOSIS — I517 Cardiomegaly: Secondary | ICD-10-CM | POA: Diagnosis not present

## 2022-09-11 DIAGNOSIS — U071 COVID-19: Secondary | ICD-10-CM | POA: Insufficient documentation

## 2022-09-11 DIAGNOSIS — R059 Cough, unspecified: Secondary | ICD-10-CM | POA: Diagnosis not present

## 2022-09-11 DIAGNOSIS — E861 Hypovolemia: Secondary | ICD-10-CM | POA: Insufficient documentation

## 2022-09-11 LAB — CBC
HCT: 48.3 % (ref 39.0–52.0)
Hemoglobin: 16.2 g/dL (ref 13.0–17.0)
MCH: 33.3 pg (ref 26.0–34.0)
MCHC: 33.5 g/dL (ref 30.0–36.0)
MCV: 99.2 fL (ref 80.0–100.0)
Platelets: 137 10*3/uL — ABNORMAL LOW (ref 150–400)
RBC: 4.87 MIL/uL (ref 4.22–5.81)
RDW: 12.9 % (ref 11.5–15.5)
WBC: 3.4 10*3/uL — ABNORMAL LOW (ref 4.0–10.5)
nRBC: 0 % (ref 0.0–0.2)

## 2022-09-11 LAB — TROPONIN I (HIGH SENSITIVITY): Troponin I (High Sensitivity): 7 ng/L (ref <18)

## 2022-09-11 LAB — BASIC METABOLIC PANEL
Anion gap: 11 (ref 5–15)
BUN: 10 mg/dL (ref 6–20)
CO2: 27 mmol/L (ref 22–32)
Calcium: 8.7 mg/dL — ABNORMAL LOW (ref 8.9–10.3)
Chloride: 98 mmol/L (ref 98–111)
Creatinine, Ser: 1.38 mg/dL — ABNORMAL HIGH (ref 0.61–1.24)
GFR, Estimated: >60 mL/min (ref >60)
Glucose, Bld: 98 mg/dL (ref 70–99)
Potassium: 3.6 mmol/L (ref 3.5–5.1)
Sodium: 136 mmol/L (ref 135–145)

## 2022-09-11 LAB — SARS CORONAVIRUS 2 BY RT PCR: SARS Coronavirus 2 by RT PCR: POSITIVE — AB

## 2022-09-11 MED ORDER — PAXLOVID (300/100) 20 X 150 MG & 10 X 100MG PO TBPK: 3.0000 | ORAL_TABLET | Freq: Two times a day (BID) | ORAL | 0 refills | Status: AC

## 2022-09-11 MED ORDER — SODIUM CHLORIDE 0.9 % IV BOLUS
1000.0000 mL | Freq: Once | INTRAVENOUS | Status: AC
  Administered 2022-09-11: 1000 mL via INTRAVENOUS

## 2022-09-11 NOTE — ED Triage Notes (Signed)
The pt has a speech deficit   she tested pos home test this past Wednesday and she visited him then.  He has had a cough low grade fever and she tested him  for covid and his test was pos.. the home tests were from 2020  she just wants to be sure

## 2022-09-11 NOTE — ED Triage Notes (Signed)
Pacemaker placed 2 weeks ago  and he has some pain around the surgical site lt upper

## 2022-09-11 NOTE — ED Provider Notes (Signed)
[place] EMERGENCY DEPARTMENT AT [facility] Provider Note   CSN: [id number] Arrival date & time: 09/11/22  1503     History  Chief Complaint  Patient presents with   Covid Positive   Patient's mother is at bedside and helps provide the history as the patient is nonverbal  Jeffrey Jones is a 32 y.o. male with Down syndrome mostly averbal, first-degree AV block pacemaker that was placed 6/26, presents with concern for a positive home COVID test.  Patient's mom states the home test were from 2020 and she is unsure if they are reliable.  She had COVID last week.  Patient now has onset of cough, stuffy nose, and temperature of 99 at home.  Is any fever or chills at home.  Denies any nausea or vomiting.  Due to having COVID they did not going to get the incision check from the pacemaker removal.  HPI     Home Medications Prior to Admission medications   Medication Sig Start Date End Date Taking? Authorizing Provider  nirmatrelvir & ritonavir (PAXLOVID, 300/100,) 20 x 150 MG & 10 x 100MG  TBPK Take 3 tablets by mouth 2 (two) times daily for 5 days. 09/11/22 09/16/22 Yes Arabella Merles, PA-C      Allergies    Patient has no known allergies.    Review of Systems   Review of Systems  Physical Exam Updated Vital Signs BP 107/61   Pulse 69   Temp 98.5 F (36.9 C) (Oral)   Resp 16   Ht 5' (1.524 m)   Wt 90.7 kg   SpO2 100%   BMI 39.05 kg/m  Physical Exam Vitals and nursing note reviewed.  Constitutional:      General: He is not in acute distress.    Appearance: He is well-developed.  HENT:     Head: Normocephalic and atraumatic.     Mouth/Throat:     Mouth: Mucous membranes are moist.     Pharynx: No oropharyngeal exudate or posterior oropharyngeal erythema.  Eyes:     Conjunctiva/sclera: Conjunctivae normal.  Neck:     Comments: Right cervical lymphadenopathy Cardiovascular:     Rate and Rhythm: Normal rate and regular rhythm.     Heart sounds: No  murmur heard. Pulmonary:     Effort: Pulmonary effort is normal. No respiratory distress.     Breath sounds: Normal breath sounds.  Abdominal:     General: Abdomen is flat. Bowel sounds are normal.     Palpations: Abdomen is soft.     Tenderness: There is no abdominal tenderness.  Musculoskeletal:        General: No swelling.     Cervical back: Neck supple.  Skin:    General: Skin is warm and dry.     Capillary Refill: Capillary refill takes less than 2 seconds.     Comments: Healing surgical incision noted on the left upper chest, no signs of drainage, erythema, edema  Neurological:     General: No focal deficit present.     Mental Status: He is alert.  Psychiatric:        Mood and Affect: Mood normal.     ED Results / Procedures / Treatments   Labs (all labs ordered are listed, but only abnormal results are displayed) Labs Reviewed  SARS CORONAVIRUS 2 BY RT PCR - Abnormal; Notable for the following components:      Result Value   SARS Coronavirus 2 by RT PCR POSITIVE (*)  All other components within normal limits  BASIC METABOLIC PANEL - Abnormal; Notable for the following components:   Creatinine, Ser 1.38 (*)    Calcium 8.7 (*)    All other components within normal limits  CBC - Abnormal; Notable for the following components:   WBC 3.4 (*)    Platelets 137 (*)    All other components within normal limits  TROPONIN I (HIGH SENSITIVITY)    EKG EKG Interpretation Date/Time:  Saturday September 11 2022 16:04:46 EDT Ventricular Rate:  79 PR Interval:  164 QRS Duration:  154 QT Interval:  410 QTC Calculation: 470 R Axis:   -64  Text Interpretation: Atrial-sensed ventricular-paced rhythm Abnormal ECG When compared with ECG of 25-Aug-2022 15:32, No significant change since prior 6/24 Confirmed by Meridee Score [phone]) on 09/11/2022 4:22:15 PM  Radiology DG Chest 2 View  Result Date: 09/11/2022 CLINICAL DATA:  Cough EXAM: CHEST - 2 VIEW COMPARISON:  08/25/2022  FINDINGS: Left-sided implanted cardiac device remains in place. Surgical clips in the mediastinum. Stable cardiomegaly. No focal airspace consolidation, pleural effusion, or pneumothorax. IMPRESSION: Cardiomegaly. No acute cardiopulmonary findings. Electronically Signed   By: Duanne Guess D.O.   On: 09/11/2022 17:53    Procedures Procedures    Medications Ordered in ED Medications  sodium chloride 0.9 % bolus 1,000 mL (0 mLs Intravenous Stopped 09/11/22 1922)    ED Course/ Medical Decision Making/ A&P                             Medical Decision Making Risk Prescription drug management.   32 y.o. male with pertinent past medical history of Down syndrome mostly averbal, first-degree AV block pacemaker that was placed 6/26 presents to the ED for concern of positive home COVID test and wanting to have pacemaker incision checked  Differential diagnosis includes but is not limited to COVID, pneumonia, bronchitis, viral URI, healing incision, infected surgical incision Given positive home COVID test and mom who also had COVID, suspect he might have COVID No fevers or chills at home, pneumonia less likely.  Could consider bronchitis or viral URI if COVID test negative    ED Course:  Patient overall well-appearing with vital signs within normal limits except for slightly low blood pressure at 105/56.  He is breathing comfortably on room air and satting 99%.  He has a slight cough on exam.  When asked if he has difficulty breathing he points to his nose, suspect he might have a stuffy nose.  Surgical incision looks appropriate and healing well, no signs of drainage or surrounding erythema.  No fevers or chills at home. CXR with no consolidations concerning for pneumonia COVID-positive Labs consistent with baseline with no concerning findings. Given patient blood pressure slightly low at , will give bolus of normal saline  Upon re-evaluation, patient overall well-appearing and eating.   Vital signs have remained stable.  Appropriate to discharge home at this time. Patient prescribed paxlovid at request of mother at bedside and with discussion with Dr. Charm Barges.   Impression: COVID-positive  Disposition:  The patient was discharged home with instructions to keep wearing a mask, hand wash, keep hydrated, may use over-the-counter cold and flu medications to help with symptoms. Paxlovid prescribed Return precautions given.  Lab Tests: I Ordered, and personally interpreted labs.  The pertinent results include:   Covid positive BMP with creatinine of 1.38, this is consistent with his levels from 1 month ago. CBC with platelets  at 137, this seems to be consistent with patient baseline  Imaging Studies ordered: I ordered imaging studies including chest x-ray I independently visualized the imaging with scope of interpretation limited to determining acute life threatening conditions related to emergency care. Imaging showed no consolidation or effusions I agree with the radiologist interpretation   Cardiac Monitoring: / EKG: The patient was maintained on a cardiac monitor.  I personally viewed and interpreted the cardiac monitored which showed an underlying rhythm of: Paced rhythm   Consultations Obtained: None   Co morbidities that complicate the patient evaluation  Down syndrome mostly averbal, first-degree AV block pacemaker that was placed 6/26  Social Determinants of Health:  unknown              Final Clinical Impression(s) / ED Diagnoses Final diagnoses:  COVID-19  Hypotension due to hypovolemia    Rx / DC Orders ED Discharge Orders          Ordered    nirmatrelvir & ritonavir (PAXLOVID, 300/100,) 20 x 150 MG & 10 x 100MG  TBPK  2 times daily        09/11/22 1912              Arabella Merles, PA-C 09/11/22 1942    Terrilee Files, MD 09/12/22 [id number]

## 2022-09-11 NOTE — Discharge Instructions (Addendum)
Please wear a mask for at least 5 days after you started to have symptoms. Do not share utensils or dishware with others.  Continue to wash your hands frequently  You may use OTC cold and flu medications as needed to help your symptoms.   You have been prescribed Paxlovid, you may take this medication to help treat your covid. Take this medication as prescribed  Continue to drink plenty of fluids as your blood pressure was low today.   Your chest X-ray showed no signs of pneumonia.   Continue to monitor your symptoms. Return to the ER if you develop worsening shortness of breath, chest pain, dizziness, fever not controlled with Tylenol (above 100.4).

## 2022-09-22 ENCOUNTER — Telehealth: Payer: Self-pay

## 2022-09-22 NOTE — Telephone Encounter (Signed)
Attempted to contact patients sister. No answer, unable to leave VM.

## 2022-09-22 NOTE — Telephone Encounter (Signed)
Following alert received from [employer] received for HVR. Event occurred 7/23 @ 13:19, mean HR 200, duration 56sec, possibly periods of short V to A. vs retrograde conduction.  First 8sec of EGM with PVC's.  Possible dual tachycardia, PPM indication heart block.  Attempted to contact patients mother Haik Mahoney, Mother on [place]), no answer. LMTCB. No tachy alerts triggered on ILR that was explanted 08/25/22. Pacemaker was implanted 08/26/22 and no tachy alerts until yesterday.

## 2022-09-22 NOTE — Telephone Encounter (Signed)
The patient mom is about to board a flight. She states the patient is with his sister. Her phone number is [phone].

## 2022-09-23 ENCOUNTER — Ambulatory Visit: Payer: Medicare Other | Attending: Cardiovascular Disease

## 2022-09-23 DIAGNOSIS — R55 Syncope and collapse: Secondary | ICD-10-CM

## 2022-09-23 LAB — CUP PACEART INCLINIC DEVICE CHECK
Battery Remaining Longevity: 63 mo
Battery Voltage: 2.99 V
Brady Statistic RA Percent Paced: 36 %
Brady Statistic RV Percent Paced: 99.91 %
Date Time Interrogation Session: 20240725162534
Implantable Lead Connection Status: 753985
Implantable Lead Connection Status: 753985
Implantable Lead Implant Date: 20240626
Implantable Lead Implant Date: 20240626
Implantable Lead Location: 753859
Implantable Lead Location: 753860
Implantable Pulse Generator Implant Date: 20240626
Lead Channel Impedance Value: 487.5 Ohm
Lead Channel Impedance Value: 525 Ohm
Lead Channel Pacing Threshold Amplitude: 0.75 V
Lead Channel Pacing Threshold Amplitude: 0.75 V
Lead Channel Pacing Threshold Amplitude: 1 V
Lead Channel Pacing Threshold Amplitude: 1 V
Lead Channel Pacing Threshold Pulse Width: 0.5 ms
Lead Channel Pacing Threshold Pulse Width: 0.5 ms
Lead Channel Pacing Threshold Pulse Width: 0.5 ms
Lead Channel Pacing Threshold Pulse Width: 0.5 ms
Lead Channel Sensing Intrinsic Amplitude: 5 mV
Lead Channel Sensing Intrinsic Amplitude: 9.6 mV
Lead Channel Setting Pacing Amplitude: 3.5 V
Lead Channel Setting Pacing Amplitude: 3.5 V
Lead Channel Setting Pacing Pulse Width: 0.5 ms
Lead Channel Setting Sensing Sensitivity: 2 mV
Pulse Gen Model: 2272
Pulse Gen Serial Number: 8185838

## 2022-09-23 NOTE — Telephone Encounter (Signed)
Patient has device clinic apt today 09/23/22.

## 2022-09-23 NOTE — Telephone Encounter (Signed)
Reviewed with Dr. Ladona Ridgel.  No action at this time.  Will continue to monitor.

## 2022-09-23 NOTE — Patient Instructions (Signed)

## 2022-09-23 NOTE — Progress Notes (Signed)
Wound check appointment. Steri-strips removed prior to appointment. Wound without redness or edema. Incision edges approximated, wound well healed. Normal device function. Thresholds, sensing, and impedances consistent with implant measurements. Device programmed at 3.5V/auto capture programmed on for extra safety margin until 3 month visit. Histogram distribution appropriate for patient and level of activity. No mode switches.  One HVR episode noted. Pt unaware of episode.  Patient educated about wound care, arm mobility, lifting restrictions. ROV in 3 months with implanting physician.

## 2022-11-24 ENCOUNTER — Ambulatory Visit: Payer: Medicare Other

## 2022-11-24 DIAGNOSIS — I440 Atrioventricular block, first degree: Secondary | ICD-10-CM

## 2022-11-24 DIAGNOSIS — R55 Syncope and collapse: Secondary | ICD-10-CM

## 2022-11-24 LAB — CUP PACEART REMOTE DEVICE CHECK
Battery Remaining Longevity: 61 mo
Battery Remaining Percentage: 95.5 %
Battery Voltage: 2.98 V
Brady Statistic AP VP Percent: 41 %
Brady Statistic AP VS Percent: 1 %
Brady Statistic AS VP Percent: 58 %
Brady Statistic AS VS Percent: 1 %
Brady Statistic RA Percent Paced: 41 %
Brady Statistic RV Percent Paced: 99 %
Date Time Interrogation Session: 20240925045018
Implantable Lead Connection Status: 753985
Implantable Lead Connection Status: 753985
Implantable Lead Implant Date: 20240626
Implantable Lead Implant Date: 20240626
Implantable Lead Location: 753859
Implantable Lead Location: 753860
Implantable Pulse Generator Implant Date: 20240626
Lead Channel Impedance Value: 490 Ohm
Lead Channel Impedance Value: 550 Ohm
Lead Channel Pacing Threshold Amplitude: 0.75 V
Lead Channel Pacing Threshold Amplitude: 1 V
Lead Channel Pacing Threshold Pulse Width: 0.5 ms
Lead Channel Pacing Threshold Pulse Width: 0.5 ms
Lead Channel Sensing Intrinsic Amplitude: 10.1 mV
Lead Channel Sensing Intrinsic Amplitude: 3.1 mV
Lead Channel Setting Pacing Amplitude: 3.5 V
Lead Channel Setting Pacing Amplitude: 3.5 V
Lead Channel Setting Pacing Pulse Width: 0.5 ms
Lead Channel Setting Sensing Sensitivity: 2 mV
Pulse Gen Model: 2272
Pulse Gen Serial Number: 8185838

## 2022-11-30 ENCOUNTER — Ambulatory Visit (HOSPITAL_COMMUNITY): Payer: Medicare Other

## 2022-12-06 ENCOUNTER — Ambulatory Visit (HOSPITAL_COMMUNITY): Payer: Medicare Other | Attending: Cardiology

## 2022-12-06 DIAGNOSIS — I059 Rheumatic mitral valve disease, unspecified: Secondary | ICD-10-CM | POA: Insufficient documentation

## 2022-12-06 LAB — ECHOCARDIOGRAM COMPLETE
Area-P 1/2: 3.42 cm2
MV M vel: 3.9 m/s
MV Peak grad: 60.8 mm[Hg]
Radius: 0.7 cm
S' Lateral: 3.1 cm

## 2022-12-10 NOTE — Progress Notes (Signed)
Remote pacemaker transmission.   

## 2022-12-13 ENCOUNTER — Ambulatory Visit: Payer: Medicare Other | Admitting: Cardiovascular Disease

## 2022-12-20 ENCOUNTER — Ambulatory Visit: Payer: Medicare Other | Attending: Cardiovascular Disease | Admitting: Cardiovascular Disease

## 2022-12-20 DIAGNOSIS — I440 Atrioventricular block, first degree: Secondary | ICD-10-CM | POA: Insufficient documentation

## 2022-12-21 ENCOUNTER — Encounter: Payer: Self-pay | Admitting: Internal Medicine

## 2022-12-21 ENCOUNTER — Encounter: Payer: Self-pay | Admitting: Cardiovascular Disease

## 2022-12-21 ENCOUNTER — Ambulatory Visit (INDEPENDENT_AMBULATORY_CARE_PROVIDER_SITE_OTHER): Payer: Medicare Other | Admitting: Internal Medicine

## 2022-12-21 VITALS — BP 110/86 | HR 85 | Ht 60.0 in | Wt 194.8 lb

## 2022-12-21 DIAGNOSIS — I440 Atrioventricular block, first degree: Secondary | ICD-10-CM | POA: Diagnosis not present

## 2022-12-21 NOTE — Progress Notes (Signed)
HPI [name] returns today for ongoing evaluation of bradycardia and syncope. 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 He has had problems with recurrent syncope. He had an ILR placed and has had nocturnal high grade heart block. He has not passed out since his device was placed. His episodes of heart block with ventricular rates in the 20's have mostly occurred at night between 12 am and 6 am. He understands but does not speak much. He denies sob. His ECG shows NSR with marked first degree AV block. He underwent PPM insertion for worsening symptoms. Repeat echo showed normal LV function. He denies syncope.     No Known Allergies   No current outpatient medications on file.   No current facility-administered medications for this visit.     Past Medical History:  Diagnosis Date   Down's syndrome    Seizures (HCC)     ROS:   All systems reviewed and negative except as noted in the HPI.   Past Surgical History:  Procedure Laterality Date   BACK SURGERY     LOOP RECORDER REMOVAL N/A 08/25/2022   Procedure: LOOP RECORDER REMOVAL;  Surgeon: Marinus Maw, MD;  Location: [facility] INVASIVE CV LAB;  Service: Cardiovascular;  Laterality: N/A;   PACEMAKER IMPLANT N/A 08/25/2022   Procedure: PACEMAKER IMPLANT;  Surgeon: Marinus Maw, MD;  Location: [facility] INVASIVE CV LAB;  Service: Cardiovascular;  Laterality: N/A;   REPAIR OF LEFT VENTRICLE LACERATION       Family History  Problem Relation Age of Onset   Diabetes Father    Bell's palsy Father    Seizures Neg Hx      Social History   Socioeconomic History   Marital status: Single    Spouse name: Not on file   Number of children: 0   Years of education: HS   Highest education level: Not on file  Occupational History   Occupation: N/A  Tobacco Use   Smoking status: Never   Smokeless tobacco: Never  Vaping Use   Vaping status: Never Used  Substance and Sexual Activity    Alcohol use: No   Drug use: No   Sexual activity: Not on file  Other Topics Concern   Not on file  Social History Narrative   Patient occasionally drinks soft drinks.   Patient is right handed but does other tasks with his left hand.          Social Determinants of Health   Financial Resource Strain: Not on file  Food Insecurity: No Food Insecurity (03/24/2022)   Hunger Vital Sign    Worried About Running Out of Food in the Last Year: Never true    Ran Out of Food in the Last Year: Never true  Transportation Needs: Unmet Transportation Needs (03/24/2022)   PRAPARE - Administrator, Civil Service (Medical): No    Lack of Transportation (Non-Medical): Yes  Physical Activity: Not on file  Stress: Not on file  Social Connections: Not on file  Intimate Partner Violence: Not on file     BP 110/86   Pulse 85   Ht 5' (1.524 m)   Wt 194 lb 12.8 oz (88.4 kg)   SpO2 98%   BMI 38.04 kg/m   Physical Exam:  Well appearing NAD HEENT: Unremarkable Neck:  No JVD, no thyromegally Lymphatics:  No adenopathy Back:  No CVA tenderness Lungs:  Clear  HEART:  Regular rate rhythm, no murmurs, no rubs, no clicks Abd:  soft, positive bowel sounds, no organomegally, no rebound, no guarding Ext:  2 plus pulses, no edema, no cyanosis, no clubbing Skin:  No rashes no nodules Neuro:  CN II through XII intact, motor grossly intact  DEVICE  Normal device function.  See PaceArt for details.   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  VSD - he is s/p repair and I do not hear any leak on exam.  PPM - his [employer] DDD PM is working normally. VT - he has had 2 episodes which are asymptomatic. No syncope. He has preserved LV function and will undergo watchful waiting. I told his family to call us if he were to have syncope or other symptoms.    Jeffrey Jones Jeffrey Exley,MD

## 2022-12-21 NOTE — Patient Instructions (Addendum)
Medication Instructions:  Your physician recommends that you continue on your current medications as directed. Please refer to the Current Medication list given to you today.  *If you need a refill on your cardiac medications before your next appointment, please call your pharmacy*  Lab Work: None ordered.  If you have labs (blood work) drawn today and your tests are completely normal, you will receive your results only by: MyChart Message (if you have MyChart) OR A paper copy in the mail If you have any lab test that is abnormal or we need to change your treatment, we will call you to review the results.  Testing/Procedures: None ordered.  Follow-Up: At [facility], you and your health needs are our priority.  As part of our continuing mission to provide you with exceptional heart care, we have created designated Provider Care Teams.  These Care Teams include your primary Cardiologist (physician) and Advanced Practice Providers (APPs -  Physician Assistants and Nurse Practitioners) who all work together to provide you with the care you need, when you need it.   Your next appointment:   May 05 2023 at 3:30pm  The format for your next appointment:   In Person  Provider:   Lewayne Bunting, MD{or one of the following Advanced Practice Providers on your designated Care Team:   Francis Dowse, [place] Casimiro Needle "Mardelle Matte" [place], [place] Earnest Rosier, NP  Remote monitoring is used to monitor your Pacemaker/ ICD from home. This monitoring reduces the number of office visits required to check your device to one time per year. It allows [place] to keep an eye on the functioning of your device to ensure it is working properly. Important Information About Sugar

## 2022-12-24 LAB — CUP PACEART INCLINIC DEVICE CHECK
Battery Remaining Longevity: 96 mo
Battery Voltage: 2.98 V
Brady Statistic RA Percent Paced: 40 %
Brady Statistic RV Percent Paced: 99.68 %
Date Time Interrogation Session: 20241022163200
Implantable Lead Connection Status: 753985
Implantable Lead Connection Status: 753985
Implantable Lead Implant Date: 20240626
Implantable Lead Implant Date: 20240626
Implantable Lead Location: 753859
Implantable Lead Location: 753860
Implantable Pulse Generator Implant Date: 20240626
Lead Channel Impedance Value: 475 Ohm
Lead Channel Impedance Value: 525 Ohm
Lead Channel Pacing Threshold Amplitude: 0.75 V
Lead Channel Pacing Threshold Amplitude: 0.75 V
Lead Channel Pacing Threshold Amplitude: 0.75 V
Lead Channel Pacing Threshold Amplitude: 0.75 V
Lead Channel Pacing Threshold Pulse Width: 0.5 ms
Lead Channel Pacing Threshold Pulse Width: 0.5 ms
Lead Channel Pacing Threshold Pulse Width: 0.5 ms
Lead Channel Pacing Threshold Pulse Width: 0.5 ms
Lead Channel Sensing Intrinsic Amplitude: 5 mV
Lead Channel Sensing Intrinsic Amplitude: 9.3 mV
Lead Channel Setting Pacing Amplitude: 2 V
Lead Channel Setting Pacing Amplitude: 2.5 V
Lead Channel Setting Pacing Pulse Width: 0.5 ms
Lead Channel Setting Sensing Sensitivity: 2 mV
Pulse Gen Model: 2272
Pulse Gen Serial Number: 8185838

## 2023-02-22 ENCOUNTER — Ambulatory Visit (INDEPENDENT_AMBULATORY_CARE_PROVIDER_SITE_OTHER): Payer: Medicare Other

## 2023-02-22 DIAGNOSIS — I441 Atrioventricular block, second degree: Secondary | ICD-10-CM

## 2023-02-22 LAB — CUP PACEART REMOTE DEVICE CHECK
Battery Remaining Longevity: 97 mo
Battery Remaining Percentage: 95 %
Battery Voltage: 2.99 V
Brady Statistic AP VP Percent: 39 %
Brady Statistic AP VS Percent: 1 %
Brady Statistic AS VP Percent: 60 %
Brady Statistic AS VS Percent: 1 %
Brady Statistic RA Percent Paced: 39 %
Brady Statistic RV Percent Paced: 99 %
Date Time Interrogation Session: 20241224023811
Implantable Lead Connection Status: 753985
Implantable Lead Connection Status: 753985
Implantable Lead Implant Date: 20240626
Implantable Lead Implant Date: 20240626
Implantable Lead Location: 753859
Implantable Lead Location: 753860
Implantable Pulse Generator Implant Date: 20240626
Lead Channel Impedance Value: 480 Ohm
Lead Channel Impedance Value: 510 Ohm
Lead Channel Pacing Threshold Amplitude: 0.75 V
Lead Channel Pacing Threshold Amplitude: 0.75 V
Lead Channel Pacing Threshold Pulse Width: 0.5 ms
Lead Channel Pacing Threshold Pulse Width: 0.5 ms
Lead Channel Sensing Intrinsic Amplitude: 4.3 mV
Lead Channel Sensing Intrinsic Amplitude: 9.1 mV
Lead Channel Setting Pacing Amplitude: 2 V
Lead Channel Setting Pacing Amplitude: 2.5 V
Lead Channel Setting Pacing Pulse Width: 0.5 ms
Lead Channel Setting Sensing Sensitivity: 2 mV
Pulse Gen Model: 2272
Pulse Gen Serial Number: 8185838

## 2023-04-04 NOTE — Progress Notes (Signed)
 Remote pacemaker transmission.

## 2023-04-07 ENCOUNTER — Encounter: Payer: Self-pay | Admitting: Emergency Medicine

## 2023-04-07 ENCOUNTER — Ambulatory Visit
Admission: EM | Admit: 2023-04-07 | Discharge: 2023-04-07 | Disposition: A | Discharge status: Home / Self Care | Payer: Medicare Other | Attending: Internal Medicine | Admitting: Internal Medicine

## 2023-04-07 DIAGNOSIS — J209 Acute bronchitis, unspecified: Secondary | ICD-10-CM

## 2023-04-07 DIAGNOSIS — J101 Influenza due to other identified influenza virus with other respiratory manifestations: Secondary | ICD-10-CM

## 2023-04-07 DIAGNOSIS — R0602 Shortness of breath: Secondary | ICD-10-CM

## 2023-04-07 LAB — POCT INFLUENZA A/B
Influenza A, POC: POSITIVE — AB
Influenza B, POC: NEGATIVE

## 2023-04-07 MED ORDER — ONDANSETRON HCL 4 MG/2ML IJ SOLN
4.0000 mg | Freq: Once | INTRAMUSCULAR | Status: AC
  Administered 2023-04-07: 4 mg via INTRAMUSCULAR

## 2023-04-07 MED ORDER — PREDNISONE 20 MG PO TABS: 40.0000 mg | ORAL_TABLET | Freq: Every day | ORAL | 0 refills | Status: AC

## 2023-04-07 MED ORDER — DEXAMETHASONE SODIUM PHOSPHATE 10 MG/ML IJ SOLN
10.0000 mg | Freq: Once | INTRAMUSCULAR | Status: AC
  Administered 2023-04-07: 10 mg via INTRAMUSCULAR

## 2023-04-07 MED ORDER — IPRATROPIUM-ALBUTEROL 0.5-2.5 (3) MG/3ML IN SOLN
3.0000 mL | Freq: Once | RESPIRATORY_TRACT | Status: AC
  Administered 2023-04-07: 3 mL via RESPIRATORY_TRACT

## 2023-04-07 MED ORDER — PROMETHAZINE-DM 6.25-15 MG/5ML PO SYRP: 5.0000 mL | ORAL_SOLUTION | Freq: Every evening | ORAL | 0 refills | Status: AC | PRN

## 2023-04-07 MED ORDER — OSELTAMIVIR PHOSPHATE 75 MG PO CAPS: 75.0000 mg | ORAL_CAPSULE | Freq: Two times a day (BID) | ORAL | 0 refills | Status: AC

## 2023-04-07 MED ORDER — ALBUTEROL SULFATE (2.5 MG/3ML) 0.083% IN NEBU: 2.5000 mg | INHALATION_SOLUTION | Freq: Four times a day (QID) | RESPIRATORY_TRACT | 12 refills | Status: AC | PRN

## 2023-04-07 MED ORDER — ONDANSETRON 4 MG PO TBDP: 4.0000 mg | ORAL_TABLET | Freq: Three times a day (TID) | ORAL | 0 refills | Status: AC | PRN

## 2023-04-07 MED ORDER — ALBUTEROL SULFATE (2.5 MG/3ML) 0.083% IN NEBU
2.5000 mg | INHALATION_SOLUTION | Freq: Once | RESPIRATORY_TRACT | Status: AC
  Administered 2023-04-07: 2.5 mg via RESPIRATORY_TRACT

## 2023-04-07 MED ORDER — GUAIFENESIN ER 1200 MG PO TB12: 1200.0000 mg | ORAL_TABLET | Freq: Two times a day (BID) | ORAL | 0 refills | Status: AC

## 2023-04-07 NOTE — ED Triage Notes (Signed)
 Pt present with cough and congestion for 1 day

## 2023-04-07 NOTE — ED Provider Notes (Addendum)
 [facility]    CSN: [id number] Arrival date & time: 04/07/23  1308      History   Chief Complaint Chief Complaint  Patient presents with   Cough   Nasal Congestion    HPI Jeffrey Jones is a 33 y.o. male.   Jeffrey Jones is a 33 y.o. male with history of Down syndrome presenting with sister (caregiver) who contributes to the history for chief complaint of cough, nasal congestion, fever, chills, nausea, vomiting, and shortness of breath that started yesterday.  He lives with sister and her daughter who have both recently been sick with flulike symptoms.  Cough is mostly dry and nonproductive.  Cough causes posttussive emesis and is worse at nighttime.  Patient has needed nebulizers in the past when he was a child to treat bronchitis illness but sister states he has not needed this in a long time when sick. No recent antibiotic/steroid use. He reports bilateral chest tightness associated with coughing. No leg swelling, orthopnea, diarrhea, abdominal pain, dizziness, or documented fever at home reported. Sister has been giving OTC medicines without much relief. She has a nebulizer at home that she can use for him if she needs to.    Cough   Past Medical History:  Diagnosis Date   Down's syndrome    Seizures [facility])     Patient Active Problem List   Diagnosis Date Noted   1st degree AV block 05/27/2022   Severe tricuspid regurgitation 02/23/2022   Obstructive sleep apnea 12/21/2021   Syncope and collapse 12/21/2021   Snoring 08/25/2016   Excessive daytime sleepiness 08/25/2016   Muscle hypotonia 08/25/2016   Morbid obesity (HCC) 08/25/2016   Macroglossia, congenital 08/25/2016   Down syndrome 08/25/2016   History of hypercapnia 08/25/2016   Seizures (HCC) 01/20/2015    Past Surgical History:  Procedure Laterality Date   BACK SURGERY     LOOP RECORDER REMOVAL N/A 08/25/2022   Procedure: LOOP RECORDER REMOVAL;  Surgeon: Waddell Danelle ORN, MD;  [facility]: [facility]  INVASIVE CV LAB;  Service: Cardiovascular;  Laterality: N/A;   PACEMAKER IMPLANT N/A 08/25/2022   Procedure: PACEMAKER IMPLANT;  Surgeon: Waddell Danelle ORN, MD;  Location: [facility] INVASIVE CV LAB;  Service: Cardiovascular;  Laterality: N/A;   REPAIR OF LEFT VENTRICLE LACERATION         Home Medications    Prior to Admission medications   Medication Sig Start Date End Date Taking? Authorizing Provider  albuterol  (PROVENTIL ) (2.5 MG/3ML) 0.083% nebulizer solution Take 3 mLs (2.5 mg total) by nebulization every 6 (six) hours as needed for wheezing or shortness of breath. 04/07/23  Yes Enedelia Dorna HERO, FNP  Guaifenesin  1200 MG TB12 Take 1 tablet (1,200 mg total) by mouth in the morning and at bedtime. 04/07/23  Yes Enedelia Dorna HERO, FNP  ondansetron  (ZOFRAN -ODT) 4 MG disintegrating tablet Take 1 tablet (4 mg total) by mouth every 8 (eight) hours as needed for nausea or vomiting. 04/07/23  Yes Enedelia Dorna HERO, FNP  oseltamivir  (TAMIFLU ) 75 MG capsule Take 1 capsule (75 mg total) by mouth every 12 (twelve) hours. 04/07/23  Yes Enedelia Dorna HERO, FNP  predniSONE  (DELTASONE ) 20 MG tablet Take 2 tablets (40 mg total) by mouth daily with breakfast for 5 days. 04/07/23 04/12/23 Yes Enedelia Dorna HERO, FNP  promethazine -dextromethorphan (PROMETHAZINE -DM) 6.25-15 MG/5ML syrup Take 5 mLs by mouth at bedtime as needed for cough. 04/07/23  Yes Enedelia Dorna HERO, FNP    Family History Family History  Problem Relation  Age of Onset   Diabetes Father    Bell's palsy Father    Seizures Neg Hx     Social History Social History   Tobacco Use   Smoking status: Never   Smokeless tobacco: Never  Vaping Use   Vaping status: Never Used  Substance Use Topics   Alcohol use: No   Drug use: No     Allergies   Patient has no known allergies.   Review of Systems Review of Systems  Respiratory:  Positive for cough.   Per HPI   Physical Exam Triage Vital Signs ED Triage Vitals [04/07/23  1405]  Encounter Vitals Group     BP      Systolic BP Percentile      Diastolic BP Percentile      Pulse Rate 84     Resp (!) 24     Temp 98.3 F (36.8 C)     Temp Source Oral     SpO2 93 %     Weight      Height      Head Circumference      Peak Flow      Pain Score      Pain Loc      Pain Education      Exclude from Growth Chart    No data found.  Updated Vital Signs Pulse 84   Temp 98.3 F (36.8 C) (Oral)   Resp (!) 24   SpO2 93%   Visual Acuity Right Eye Distance:   Left Eye Distance:   Bilateral Distance:    Right Eye Near:   Left Eye Near:    Bilateral Near:     Physical Exam Vitals and nursing note reviewed.  Constitutional:      Appearance: He is ill-appearing. He is not toxic-appearing.  HENT:     Head: Normocephalic and atraumatic.     Right Ear: Hearing, tympanic membrane, ear canal and external ear normal.     Left Ear: Hearing, tympanic membrane, ear canal and external ear normal.     Nose: Congestion present.     Mouth/Throat:     Lips: Pink.     Mouth: Mucous membranes are moist. No injury or oral lesions.     Dentition: Normal dentition.     Tongue: No lesions.     Pharynx: Oropharynx is clear. Uvula midline. No pharyngeal swelling, oropharyngeal exudate, posterior oropharyngeal erythema, uvula swelling or postnasal drip.     Tonsils: No tonsillar exudate.  Eyes:     General: Lids are normal. Vision grossly intact. Gaze aligned appropriately.     Extraocular Movements: Extraocular movements intact.     Conjunctiva/sclera: Conjunctivae normal.  Neck:     Trachea: Trachea and phonation normal.  Cardiovascular:     Rate and Rhythm: Normal rate and regular rhythm.     Heart sounds: Normal heart sounds, S1 normal and S2 normal.  Pulmonary:     Effort: Pulmonary effort is normal. No respiratory distress.     Breath sounds: Normal air entry. Wheezing present. No rhonchi or rales.     Comments: Diminished breath sounds throughout with  expiratory wheezing to all lung fields bilaterally. Frequent harsh cough on exam. Speaking in short sentences due to increased respiratory effort and wheezing on initial assessment.  Chest:     Chest wall: No tenderness.  Musculoskeletal:     Cervical back: Neck supple.     Right lower leg: No edema.     Left  lower leg: No edema.  Lymphadenopathy:     Cervical: No cervical adenopathy.  Skin:    General: Skin is warm and dry.     Capillary Refill: Capillary refill takes less than 2 seconds.     Findings: No rash.  Neurological:     General: No focal deficit present.     Mental Status: He is alert and oriented to person, place, and time. Mental status is at baseline.     Cranial Nerves: No dysarthria or facial asymmetry.  Psychiatric:        Mood and Affect: Mood normal.        Speech: Speech normal.        Behavior: Behavior normal.        Thought Content: Thought content normal.        Judgment: Judgment normal.      UC Treatments / Results  Labs (all labs ordered are listed, but only abnormal results are displayed) Labs Reviewed  POCT INFLUENZA A/B - Abnormal; Notable for the following components:      Result Value   Influenza A, POC Positive (*)    All other components within normal limits    EKG   Radiology No results found.  Procedures Procedures (including critical care time)  Medications Ordered in UC Medications  ipratropium-albuterol  (DUONEB) 0.5-2.5 (3) MG/3ML nebulizer solution 3 mL (3 mLs Nebulization Given 04/07/23 1437)  albuterol  (PROVENTIL ) (2.5 MG/3ML) 0.083% nebulizer solution 2.5 mg (2.5 mg Nebulization Given 04/07/23 1437)  ondansetron  (ZOFRAN ) injection 4 mg (4 mg Intramuscular Given 04/07/23 1437)  dexamethasone  (DECADRON ) injection 10 mg (10 mg Intramuscular Given 04/07/23 1438)    Initial Impression / Assessment and Plan / UC Course  I have reviewed the triage vital signs and the nursing notes.  Pertinent labs & imaging results that were  available during my care of the patient were reviewed by me and considered in my medical decision making (see chart for details).   1. Influenza A, acute bronchitis Flu A point of care testing positive. Lungs clear, vitals hemodynamically stable, therefore deferred imaging.  Duoneb and albuterol  breathing treatments given with significant improvement in breath sounds on reassessment and respiratory effort.  Zofran  4mg  IM given, dexamethasone  10mg  IM given. Prednisone  burst ordered to be started tomorrow. Home nebulizer machine given in clinic.  Albuterol  nebulizer solution ordered. Recommend supportive care for symptomatic relief as outlined in AVS.  Offered antiviral given timing of illness, Tamiflu  sent to pharmacy.   Modes of transmission, quarantine recommendations, and hand hygiene discussed.   Counseled patient on potential for adverse effects with medications prescribed/recommended today, strict ER and return-to-clinic precautions discussed, patient verbalized understanding.    Final Clinical Impressions(s) / UC Diagnoses   Final diagnoses:  Influenza A  Acute bronchitis, unspecified organism     Discharge Instructions      You have bronchitis which is inflammation of the upper airways in your lungs due to a virus. The following medicines will help with your symptoms.  You also have the flu. Take tamiflu  for the next 5 days as prescribed. Use albtuerol nebulizer treatments every 4-6 hours as needed for shortness of breath and wheezing.  Take prednisone  steroid starting tomorrow 40mg  once daily for the next 5 days. Zofran  every 8 hours as needed for nausea/vomiting. Tylenol  as needed for fever every 6 hours. Mucinex  every 12 hours as needed for cough and congestion. Promethazine  DM at bedtime as needed for cough.  If you develop any new or worsening  symptoms or if your symptoms do not start to improve, please return here or follow-up with your primary care provider. If  your symptoms are severe, please go to the emergency room.   ED Prescriptions     Medication Sig Dispense Auth. Provider   albuterol  (PROVENTIL ) (2.5 MG/3ML) 0.083% nebulizer solution Take 3 mLs (2.5 mg total) by nebulization every 6 (six) hours as needed for wheezing or shortness of breath. 75 mL Enedelia Going M, FNP   predniSONE  (DELTASONE ) 20 MG tablet Take 2 tablets (40 mg total) by mouth daily with breakfast for 5 days. 10 tablet Enedelia Going HERO, FNP   Guaifenesin  1200 MG TB12 Take 1 tablet (1,200 mg total) by mouth in the morning and at bedtime. 14 tablet Enedelia Going HERO, FNP   promethazine -dextromethorphan (PROMETHAZINE -DM) 6.25-15 MG/5ML syrup Take 5 mLs by mouth at bedtime as needed for cough. 118 mL Enedelia Going M, FNP   ondansetron  (ZOFRAN -ODT) 4 MG disintegrating tablet Take 1 tablet (4 mg total) by mouth every 8 (eight) hours as needed for nausea or vomiting. 20 tablet Enedelia Going HERO, FNP   oseltamivir  (TAMIFLU ) 75 MG capsule Take 1 capsule (75 mg total) by mouth every 12 (twelve) hours. 10 capsule Enedelia Going HERO, FNP      PDMP not reviewed this encounter.   Enedelia Going HERO, FNP 04/07/23 1522    Enedelia Going HERO, FNP 04/07/23 1539

## 2023-04-07 NOTE — Discharge Instructions (Signed)
 You have bronchitis which is inflammation of the upper airways in your lungs due to a virus. The following medicines will help with your symptoms.  You also have the flu. Take tamiflu  for the next 5 days as prescribed. Use albtuerol nebulizer treatments every 4-6 hours as needed for shortness of breath and wheezing.  Take prednisone  steroid starting tomorrow 40mg  once daily for the next 5 days. Zofran  every 8 hours as needed for nausea/vomiting. Tylenol  as needed for fever every 6 hours. Mucinex  every 12 hours as needed for cough and congestion. Promethazine  DM at bedtime as needed for cough.  If you develop any new or worsening symptoms or if your symptoms do not start to improve, please return here or follow-up with your primary care provider. If your symptoms are severe, please go to the emergency room.

## 2023-04-18 ENCOUNTER — Telehealth: Payer: Self-pay

## 2023-04-18 NOTE — Telephone Encounter (Addendum)
 Alert remote transmission: HVR detected. 1 HVR classified episode on 04/14/23 at 6:52 pm, 1 min duration, EGM c/w short V-A conduction with A&V undersensing, functional V non-capture and competitive atrial pacing; cannot exclude re-entrant tachycardia with PVCs vs VT, routed to clinic for review. 2 AMS classified episodes, longest 46 sec, EGMs c/w competitive atrial pacing during sensor activity.  Follow up as scheduled. [facility], [facility]  Called Mother, Claris Guymon, and [facility]

## 2023-04-18 NOTE — Telephone Encounter (Signed)
 Spoke w/ Mother. Pt had the flu. Did not eat the night of the 12th. Woke up the 13th c/o not feeling well. At certain points mother describes him as near syncopal and diaphoretic in appearance w/ no syncope so they sent a manual transmission. A second manual transmission requested to ensure it did not continue. Symptoms from flu have mostly resolved. Finished med reg for acute illness. Sent to MD for review.

## 2023-04-21 NOTE — Telephone Encounter (Signed)
Start toprol 25mg daily

## 2023-04-22 NOTE — Telephone Encounter (Signed)
 Pt mother called back returning call

## 2023-04-22 NOTE — Telephone Encounter (Signed)
 Mother states she would like to know if more episodes were noted on the pacemaker since patient had the flu during the time of events. She would like to clarify if patients needs medication if he was sick and now doing better.   States patient is not home now but will be later this afternoon and send.

## 2023-04-22 NOTE — Telephone Encounter (Signed)
 Remote transmission not received as of this evening on 04/22/23. Will recheck Monday 04/25/23.

## 2023-04-22 NOTE — Telephone Encounter (Signed)
 Attempted to contact patients mother [name] to advise about medication change. No answer, LMTCB.

## 2023-04-25 NOTE — Telephone Encounter (Signed)
 Spoke to Clive following up on transmission. States she pressed the button twice Saturday but the sounds were different than she remembers.    Will send directions via mychart. Advised to call with further questions or concerns.

## 2023-04-26 NOTE — Telephone Encounter (Signed)
 Remote transmission received.  No further HVR episodes.  Left message requesting Pt's mother call device clinic.

## 2023-04-26 NOTE — Telephone Encounter (Signed)
 Call back received from Pt's mother.  Advised Pt only had the one HVR event.  He was sick with the flu during that time.  Advised would discuss with Dr. Ladona Ridgel and see if Toprol she be initiated vs watchful waiting.  Advised would send a mychart message after speaking with Dr. Ladona Ridgel.

## 2023-04-26 NOTE — Telephone Encounter (Signed)
 Discussed with Dr. Ladona Ridgel.  Will defer starting any medications at this time and continue to monitor remotely.  Pt's mother advised via MyChart and previously discussed.

## 2023-05-05 ENCOUNTER — Ambulatory Visit: Payer: Medicare Other | Attending: Internal Medicine | Admitting: Internal Medicine

## 2023-05-05 ENCOUNTER — Encounter: Payer: Self-pay | Admitting: Internal Medicine

## 2023-05-05 VITALS — BP 88/68 | HR 83 | Ht 64.0 in | Wt 188.8 lb

## 2023-05-05 DIAGNOSIS — I440 Atrioventricular block, first degree: Secondary | ICD-10-CM | POA: Diagnosis present

## 2023-05-05 DIAGNOSIS — R55 Syncope and collapse: Secondary | ICD-10-CM | POA: Insufficient documentation

## 2023-05-05 LAB — CUP PACEART INCLINIC DEVICE CHECK
Battery Remaining Longevity: 91 mo
Battery Voltage: 2.98 V
Brady Statistic RA Percent Paced: 38 %
Brady Statistic RV Percent Paced: 99.69 %
Date Time Interrogation Session: 20250306165816
Implantable Lead Connection Status: 753985
Implantable Lead Connection Status: 753985
Implantable Lead Implant Date: 20240626
Implantable Lead Implant Date: 20240626
Implantable Lead Location: 753859
Implantable Lead Location: 753860
Implantable Pulse Generator Implant Date: 20240626
Lead Channel Impedance Value: 475 Ohm
Lead Channel Impedance Value: 475 Ohm
Lead Channel Pacing Threshold Amplitude: 0.75 V
Lead Channel Pacing Threshold Amplitude: 0.75 V
Lead Channel Pacing Threshold Amplitude: 0.75 V
Lead Channel Pacing Threshold Amplitude: 0.75 V
Lead Channel Pacing Threshold Pulse Width: 0.5 ms
Lead Channel Pacing Threshold Pulse Width: 0.5 ms
Lead Channel Pacing Threshold Pulse Width: 0.5 ms
Lead Channel Pacing Threshold Pulse Width: 0.5 ms
Lead Channel Sensing Intrinsic Amplitude: 5 mV
Lead Channel Sensing Intrinsic Amplitude: 9.7 mV
Lead Channel Setting Pacing Amplitude: 2 V
Lead Channel Setting Pacing Amplitude: 2.5 V
Lead Channel Setting Pacing Pulse Width: 0.5 ms
Lead Channel Setting Sensing Sensitivity: 2 mV
Pulse Gen Model: 2272
Pulse Gen Serial Number: 8185838

## 2023-05-05 NOTE — Patient Instructions (Addendum)
 Medication Instructions:  Your physician recommends that you continue on your current medications as directed. Please refer to the Current Medication list given to you today.  *If you need a refill on your cardiac medications before your next appointment, please call your pharmacy*  Lab Work: None ordered.  If you have labs (blood work) drawn today and your tests are completely normal, you will receive your results only by: MyChart Message (if you have MyChart)  If you have any lab test that is abnormal or we need to change your treatment, we will call you or send a MyChart message to review the results.  Testing/Procedures: None ordered.  Follow-Up: At [facility], you and your health needs are our priority.  As part of our continuing mission to provide you with exceptional heart care, we have created designated Provider Care Teams.  These Care Teams include your primary Cardiologist (physician) and Advanced Practice Providers (APPs -  Physician Assistants and Nurse Practitioners) who all work together to provide you with the care you need, when you need it.  Your next appointment:   1 year(s)  The format for your next appointment:   In Person  Provider:   Lewayne Bunting, MD{or one of the following Advanced Practice Providers on your designated Care Team:   Francis Dowse, [place] Casimiro Needle "Mardelle Matte" [place], [place] Earnest Rosier, NP  Note: Remote monitoring is used to monitor your Pacemaker/ ICD from home. This monitoring reduces the number of office visits required to check your device to one time per year. It allows [place] to keep an eye on the functioning of your device to ensure it is working properly.            Valet parking services will be available as well.

## 2023-05-05 NOTE — Progress Notes (Signed)
 HPI Mr. Pepitone returns today for ongoing evaluation of bradycardia and syncope. 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 He has had problems with recurrent syncope. He had an ILR placed and has had nocturnal high grade heart block. He has not passed out since his device was placed. His episodes of heart block with ventricular rates in the 20's have mostly occurred at night between 12 am and 6 am. He understands but does not speak much. He denies sob. His ECG shows NSR with marked first degree AV block. He underwent PPM insertion for worsening symptoms. Repeat echo showed normal LV function. He denies syncope.  No Known Allergies   Current Outpatient Medications  Medication Sig Dispense Refill   albuterol (PROVENTIL) (2.5 MG/3ML) 0.083% nebulizer solution Take 3 mLs (2.5 mg total) by nebulization every 6 (six) hours as needed for wheezing or shortness of breath. 75 mL 12   Guaifenesin 1200 MG TB12 Take 1 tablet (1,200 mg total) by mouth in the morning and at bedtime. 14 tablet 0   ondansetron (ZOFRAN-ODT) 4 MG disintegrating tablet Take 1 tablet (4 mg total) by mouth every 8 (eight) hours as needed for nausea or vomiting. 20 tablet 0   oseltamivir (TAMIFLU) 75 MG capsule Take 1 capsule (75 mg total) by mouth every 12 (twelve) hours. 10 capsule 0   promethazine-dextromethorphan (PROMETHAZINE-DM) 6.25-15 MG/5ML syrup Take 5 mLs by mouth at bedtime as needed for cough. 118 mL 0   No current facility-administered medications for this visit.     Past Medical History:  Diagnosis Date   Down's syndrome    Seizures (HCC)     ROS:   All systems reviewed and negative except as noted in the HPI.   Past Surgical History:  Procedure Laterality Date   BACK SURGERY     LOOP RECORDER REMOVAL N/A 08/25/2022   Procedure: LOOP RECORDER REMOVAL;  Surgeon: Marinus Maw, MD;  Location: [facility] INVASIVE CV LAB;  Service: Cardiovascular;  Laterality: N/A;    PACEMAKER IMPLANT N/A 08/25/2022   Procedure: PACEMAKER IMPLANT;  Surgeon: Marinus Maw, MD;  Location: [facility] INVASIVE CV LAB;  Service: Cardiovascular;  Laterality: N/A;   REPAIR OF LEFT VENTRICLE LACERATION       Family History  Problem Relation Age of Onset   Diabetes Father    Bell's palsy Father    Seizures Neg Hx      Social History   Socioeconomic History   Marital status: Single    Spouse name: Not on file   Number of children: 0   Years of education: HS   Highest education level: Not on file  Occupational History   Occupation: N/A  Tobacco Use   Smoking status: Never   Smokeless tobacco: Never  Vaping Use   Vaping status: Never Used  Substance and Sexual Activity   Alcohol use: No   Drug use: No   Sexual activity: Not on file  Other Topics Concern   Not on file  Social History Narrative   Patient occasionally drinks soft drinks.   Patient is right handed but does other tasks with his left hand.          Social Drivers of Corporate investment banker Strain: Not on file  Food Insecurity: No Food Insecurity (03/24/2022)   Hunger Vital Sign    Worried About Running Out of Food in the Last Year: Never true  Ran Out of Food in the Last Year: Never true  Transportation Needs: Unmet Transportation Needs (03/24/2022)   PRAPARE - Administrator, Civil Service (Medical): No    Lack of Transportation (Non-Medical): Yes  Physical Activity: Not on file  Stress: Not on file  Social Connections: Not on file  Intimate Partner Violence: Not on file     BP (!) 88/68   Pulse 83   Ht 5\' 4"  (1.626 m)   Wt 188 lb 12.8 oz (85.6 kg)   SpO2 97%   BMI 32.41 kg/m   Physical Exam:  Well appearing NAD HEENT: Unremarkable Neck:  No JVD, no thyromegally Lymphatics:  No adenopathy Back:  No CVA tenderness Lungs:  Clear HEART:  Regular rate rhythm, no murmurs, no rubs, no clicks Abd:  soft, positive bowel sounds, no organomegally, no rebound, no  guarding Ext:  2 plus pulses, no edema, no cyanosis, no clubbing Skin:  No rashes no nodules Neuro:  CN II through XII intact, motor grossly intact  EKG  DEVICE  Normal device function.  See PaceArt for details.   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 No additional syncope VSD - he is s/p repair and I do not hear any leak on exam.  PPM - his [employer] DDD PM is working normally. SVT/VT - he has had 2 episodes which are asymptomatic. No syncope. He has preserved LV function and will undergo watchful waiting. I told his family to call us if he were to have syncope or other symptoms.    Sharlot Gowda Barack Nicodemus,MD

## 2023-05-24 ENCOUNTER — Encounter: Payer: Self-pay | Admitting: Internal Medicine

## 2023-05-24 ENCOUNTER — Ambulatory Visit (INDEPENDENT_AMBULATORY_CARE_PROVIDER_SITE_OTHER): Payer: Medicare Other

## 2023-05-24 DIAGNOSIS — R55 Syncope and collapse: Secondary | ICD-10-CM

## 2023-05-24 LAB — CUP PACEART REMOTE DEVICE CHECK
Battery Remaining Longevity: 94 mo
Battery Remaining Percentage: 92 %
Battery Voltage: 2.98 V
Brady Statistic AP VP Percent: 32 %
Brady Statistic AP VS Percent: 1 %
Brady Statistic AS VP Percent: 67 %
Brady Statistic AS VS Percent: 1 %
Brady Statistic RA Percent Paced: 32 %
Brady Statistic RV Percent Paced: 99 %
Date Time Interrogation Session: 20250325020013
Implantable Lead Connection Status: 753985
Implantable Lead Connection Status: 753985
Implantable Lead Implant Date: 20240626
Implantable Lead Implant Date: 20240626
Implantable Lead Location: 753859
Implantable Lead Location: 753860
Implantable Pulse Generator Implant Date: 20240626
Lead Channel Impedance Value: 480 Ohm
Lead Channel Impedance Value: 510 Ohm
Lead Channel Pacing Threshold Amplitude: 0.75 V
Lead Channel Pacing Threshold Amplitude: 0.75 V
Lead Channel Pacing Threshold Pulse Width: 0.5 ms
Lead Channel Pacing Threshold Pulse Width: 0.5 ms
Lead Channel Sensing Intrinsic Amplitude: 11.5 mV
Lead Channel Sensing Intrinsic Amplitude: 5 mV
Lead Channel Setting Pacing Amplitude: 2 V
Lead Channel Setting Pacing Amplitude: 2.5 V
Lead Channel Setting Pacing Pulse Width: 0.5 ms
Lead Channel Setting Sensing Sensitivity: 2 mV
Pulse Gen Model: 2272
Pulse Gen Serial Number: 8185838

## 2023-06-08 IMAGING — CT CT HEAD W/O CM
4 of 6 series · 15 of 47 positions shown, 16 images · non-contrast
Comparison: Brain MRI 01/08/2015.

CLINICAL DATA: Provided history: Seizure, new onset, no history of
trauma. Neck trauma, dangerous injury mechanism.



[Series 2: head bone · axial · 0.41mm/px · z∈[-131,-59]mm · 5 of 73 slices shown]
[im 5/73  bone]
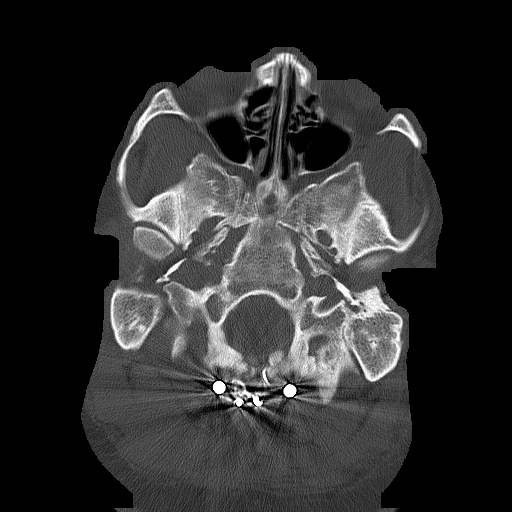
[im 14/73  bone]
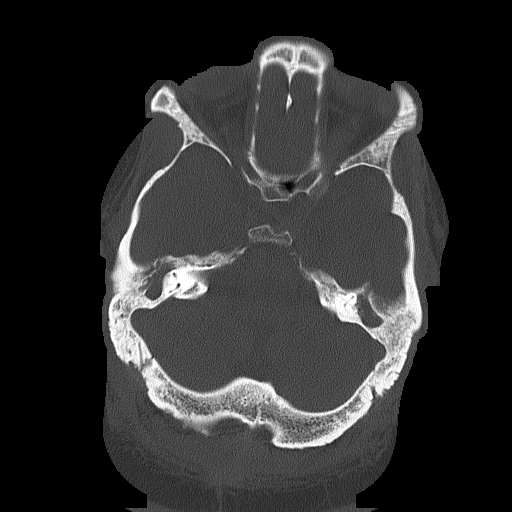
[im 23/73  bone]
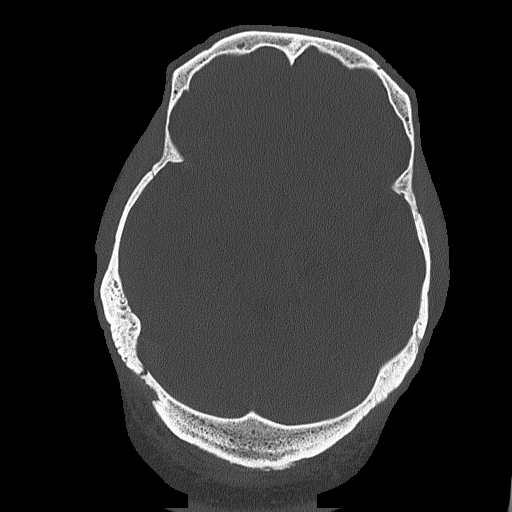
[im 32/73  bone]
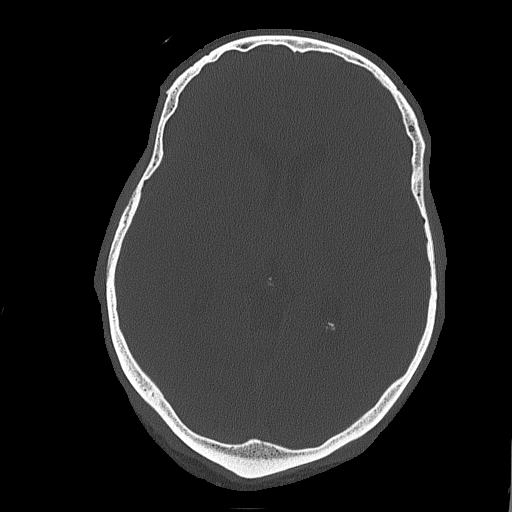
[im 41/73  bone]
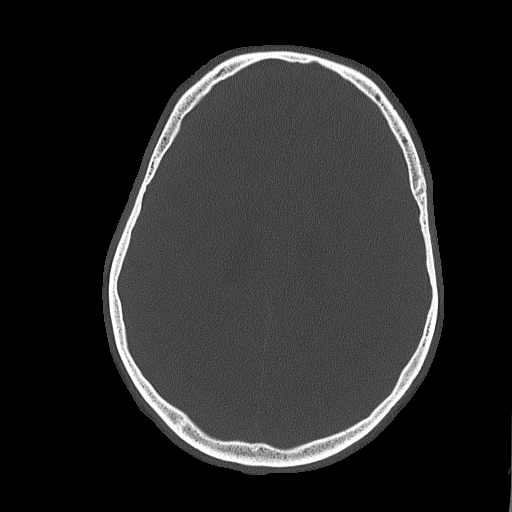

[Series 5: head wo · axial · 0.41mm/px · z∈[-114,-29]mm · 4 of 29 slices shown, 5 images]
[im 6/29  brain]
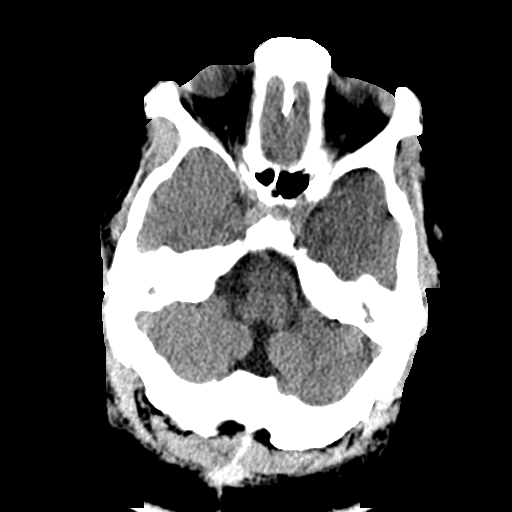
[im 6/29  bone]
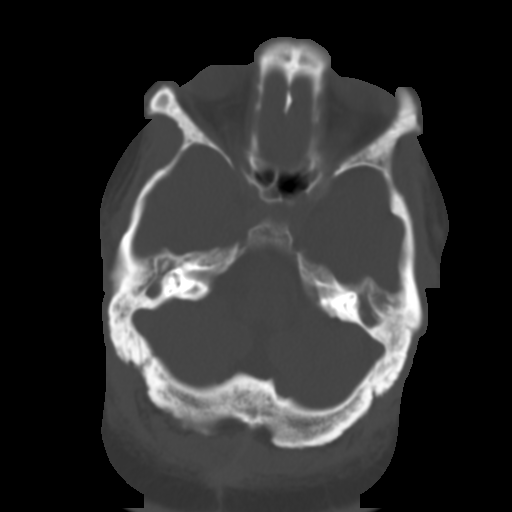
[im 12/29  brain]
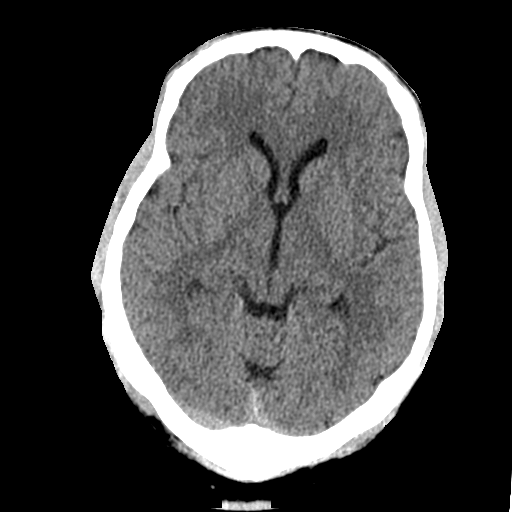
[im 17/29  brain]
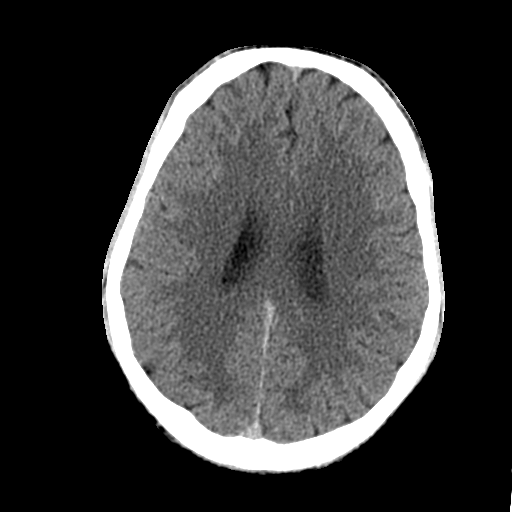
[im 23/29  brain]
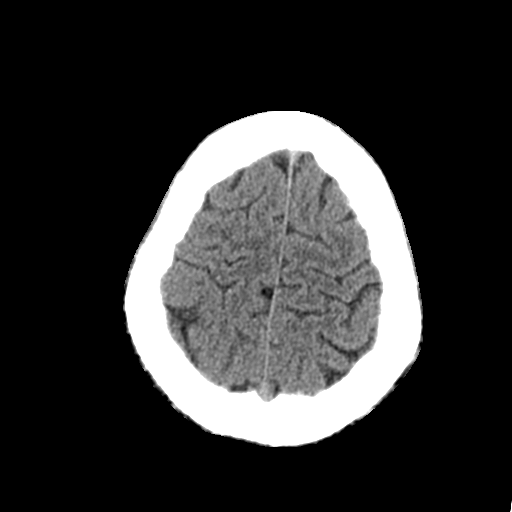

[Series 6: coronal soft tissue · coronal · 0.31mm/px · 3 of 71 slices shown]
[im 24/71  brain]
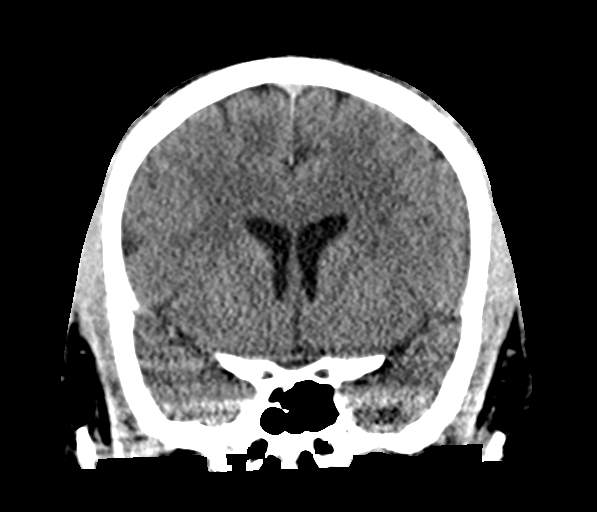
[im 32/71  brain]
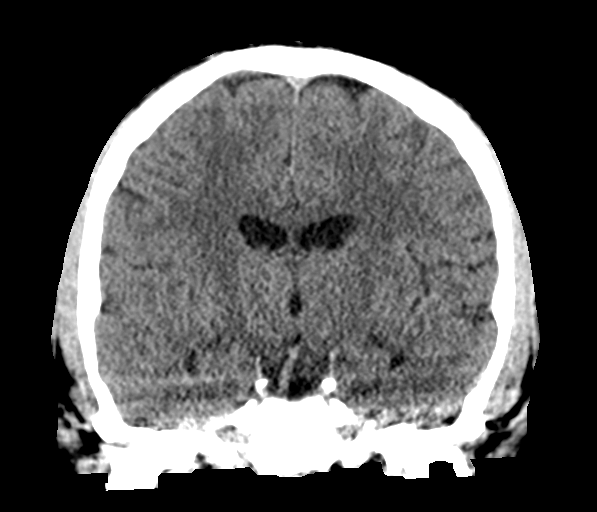
[im 39/71  brain]
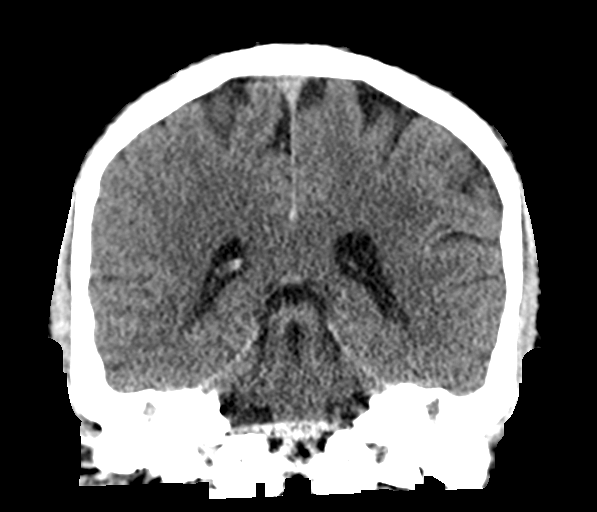

[Series 7: sagittal soft tissue · sagittal · 0.31mm/px · 3 of 59 slices shown]
[im 20/59  brain]
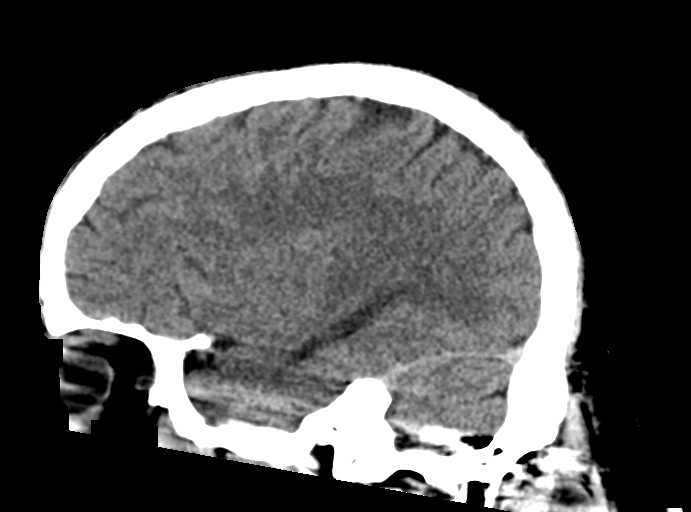
[im 30/59  brain]
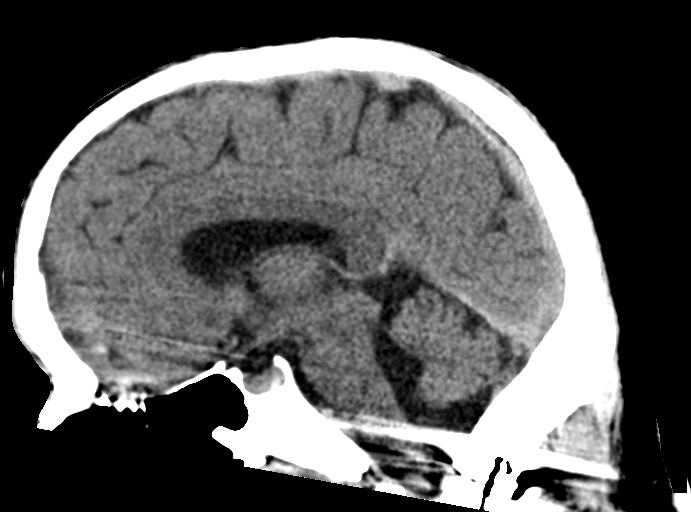
[im 39/59  brain]
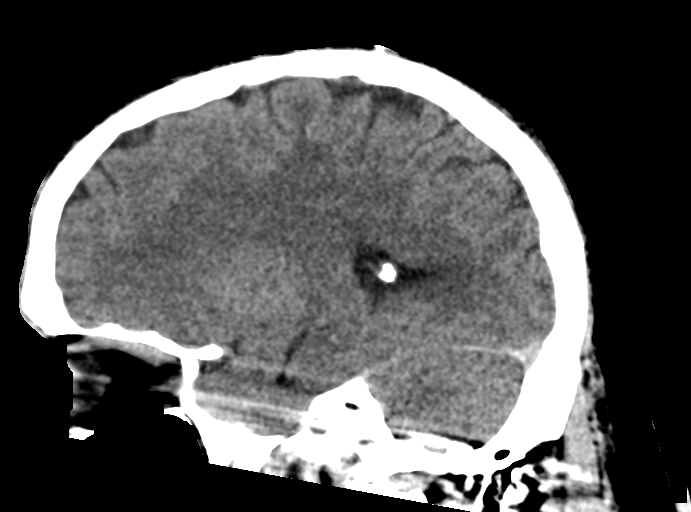

[15 of 47 positions shown; findings below may reference images not displayed]

Head CT 01/08/2015. cervical
spine radiographs 07/12/2007 (images available, report unavailable).
FINDINGS: CT HEAD FINDINGS

Brain:

Streak and beam hardening artifact arising from craniocervical
fusion hardware obscures portions of the posterior fossa inferiorly.

Cerebral volume is normal.

There is no acute intracranial hemorrhage.

No demarcated cortical infarct.

No extra-axial fluid collection.

No evidence of an intracranial mass.

No midline shift.

Vascular: No hyperdense vessel.

Skull: No fracture or aggressive osseous lesion.

Sinuses/Orbits: No mass or acute finding within the imaged orbits.
No significant paranasal sinus disease at the imaged levels.

Other: Large bilateral middle ear/mastoid effusions. Prominent
debris within the external auditory canals, bilaterally.

CT CERVICAL SPINE FINDINGS

Alignment: Nonspecific straightening of the expected cervical
lordosis. No significant spondylolisthesis.

Skull base and vertebrae: The basion-dental and atlanto-dental
intervals are maintained.No evidence of acute fracture to the
cervical spine. Congenital incomplete segmentation at C2-C3. Chronic
defects within the C1 posterior arches, bilaterally. Prior
craniocervical fusion (utilizing bilateral rods and cerclage wires)
extending from the occiput to the C3 level.

Soft tissues and spinal canal: No prevertebral fluid or swelling. No
visible canal hematoma.

Disc levels: Cervical spondylosis. No more than mild disc space
narrowing. Multilevel disc bulges and uncovertebral hypertrophy. No
appreciable high-grade spinal canal stenosis. No high-grade bony
neural foraminal narrowing.

Upper chest: No consolidation within the imaged lung apices. No
visible pneumothorax.
IMPRESSION: CT head:

1. Streak and beam hardening artifact arising from craniocervical
fusion hardware obscures portions of the posterior fossa inferiorly.
2. Within this limitation, there is no evidence of acute
intracranial abnormality.
3. Large bilateral middle ear/mastoid effusions.
4. Prominent debris within the external auditory canals,
bilaterally.

CT cervical spine:

1. No evidence of acute fracture to the cervical spine.
2. Chronic defects within the C1 posterior arches, bilaterally. The
atlantodental intervals are maintained.
3. Sequela of prior craniocervical fusion.
4. Congenital incomplete segmentation at C2-C3.
5. Cervical spondylosis, as described.

## 2023-06-08 IMAGING — MR MR HIP*R* W/O CM
5 series · 39 of 40 positions shown · non-contrast
Comparison: Right hip radiographs and pelvic CT same date.

CLINICAL DATA: Recent seizure. Right hip pain. Concern for
fracture.

EXAM:
MR OF THE RIGHT HIP WITHOUT CONTRAST
TECHNIQUE: Multiplanar, multisequence MR imaging was performed. No intravenous
contrast was administered.

[Series 9: T1 · coronal · right · 4.0mm · 1.19mm/px · 10 of 40 slices shown]
[im 1/40]
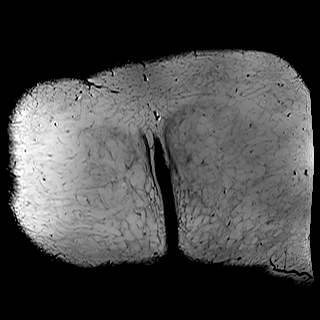
[im 5/40]
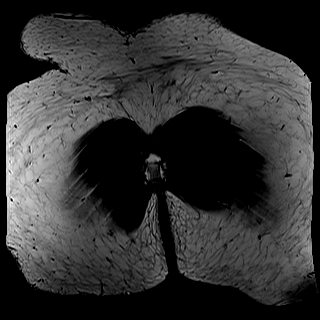
[im 9/40]
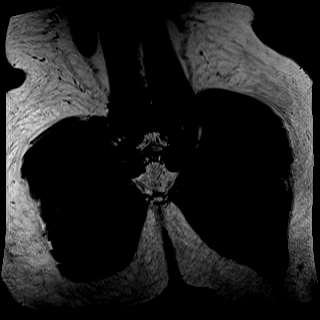
[im 14/40]
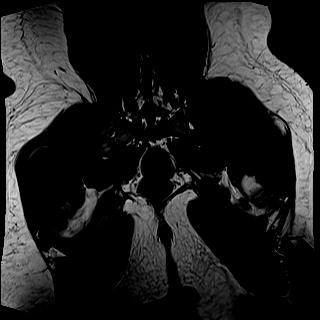
[im 18/40]
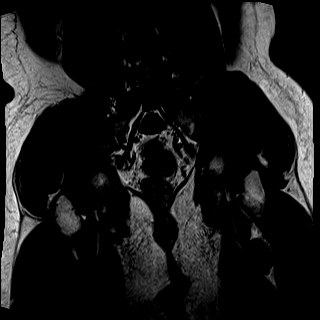
[im 22/40]
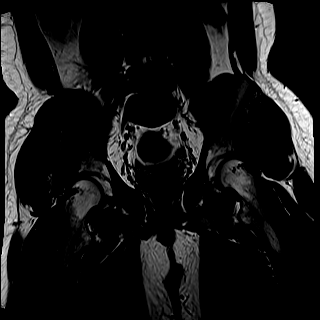
[im 27/40]
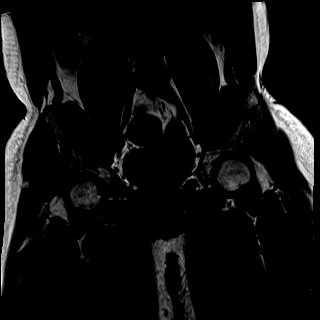
[im 31/40]
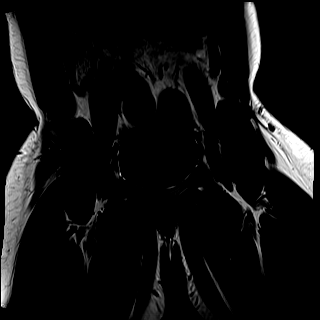
[im 35/40]
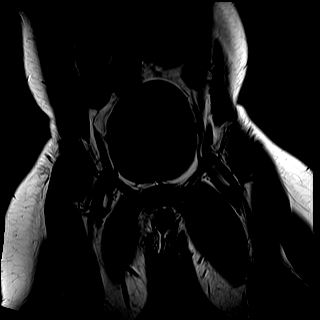
[im 40/40]
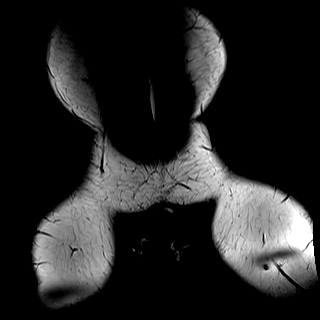

[Series 10: STIR · coronal · right · 4.0mm · 1.19mm/px · 10 of 40 slices shown]
[im 1/40]
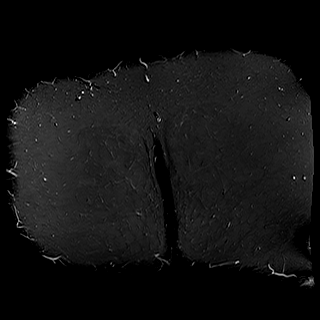
[im 4/40]
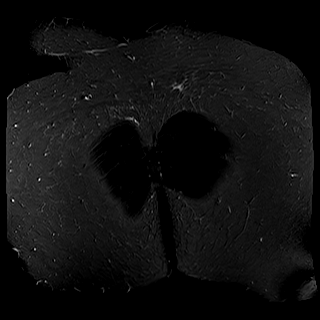
[im 8/40]
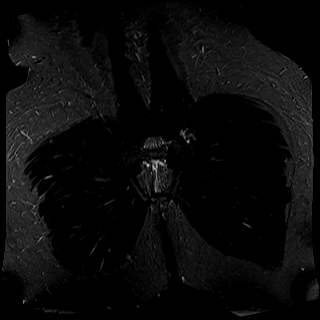
[im 12/40]
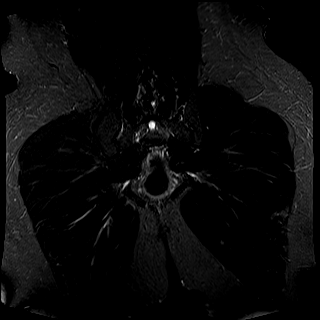
[im 16/40]
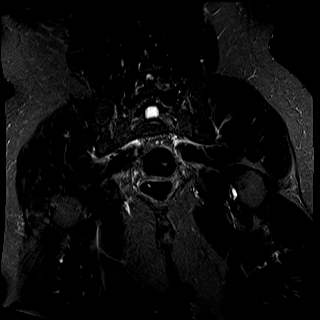
[im 20/40]
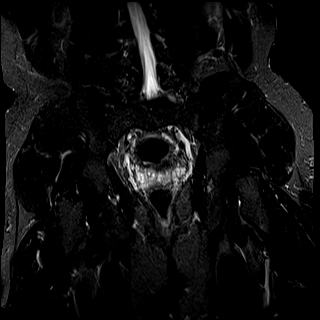
[im 24/40]
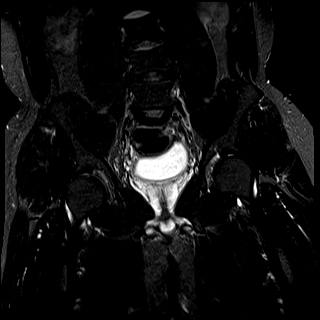
[im 28/40]
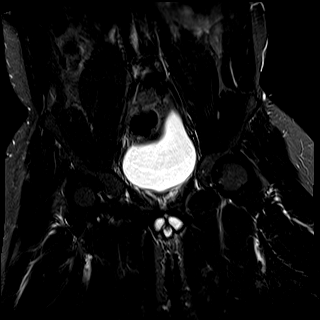
[im 32/40]
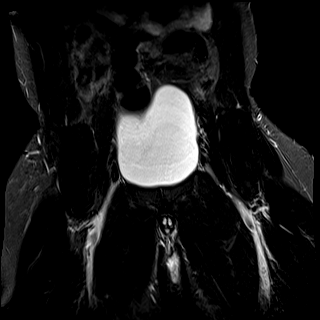
[im 40/40]
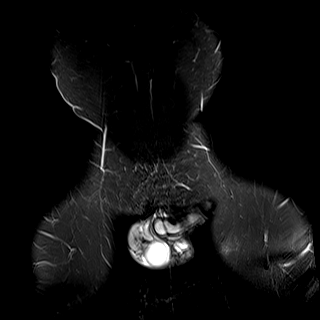

[Series 11: T2 fat-sat · axial · right · 4.0mm · 0.70mm/px · z∈[-496,-376]mm · 7 of 25 slices shown]
[im 1/25]
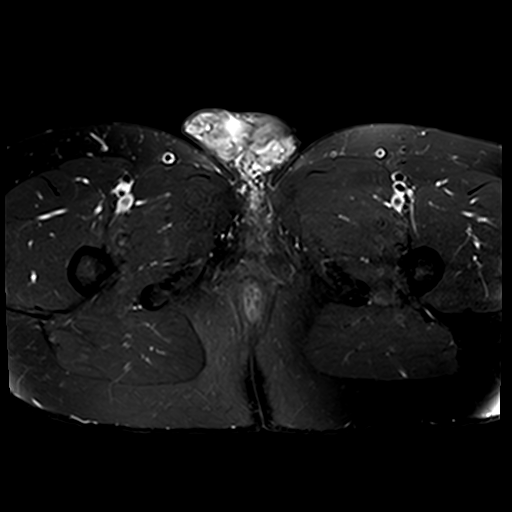
[im 5/25]
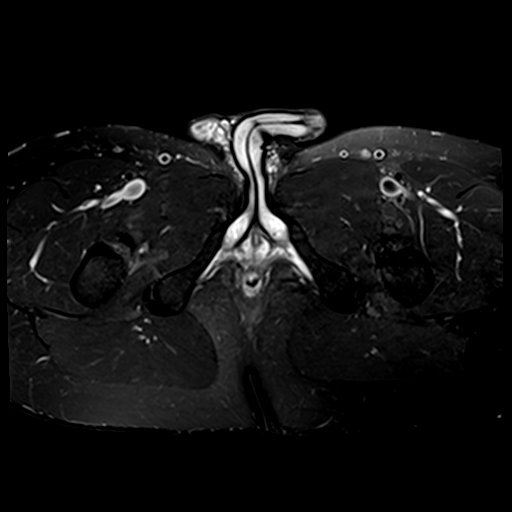
[im 9/25]
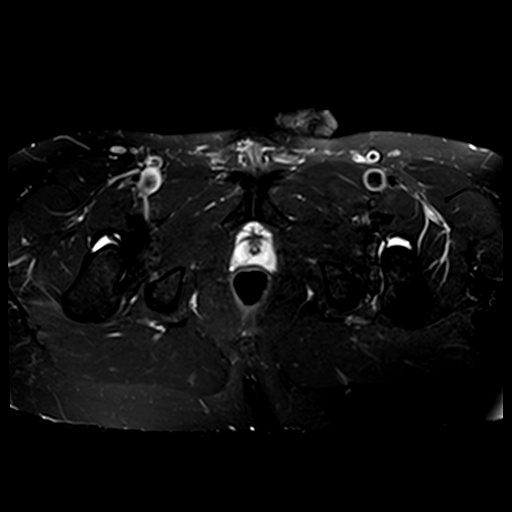
[im 13/25]
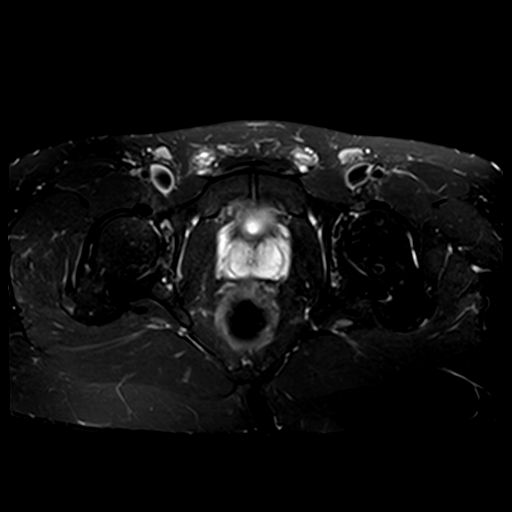
[im 17/25]
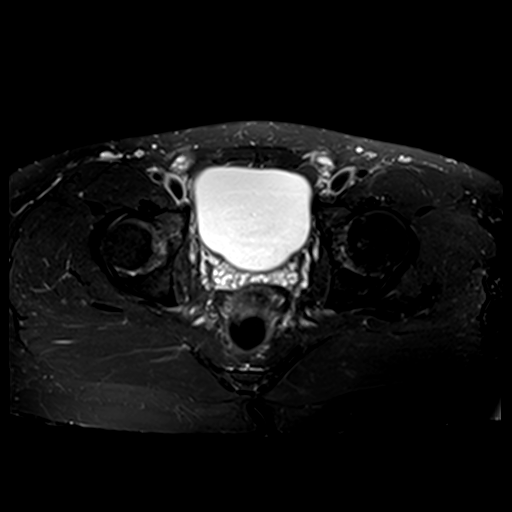
[im 21/25]
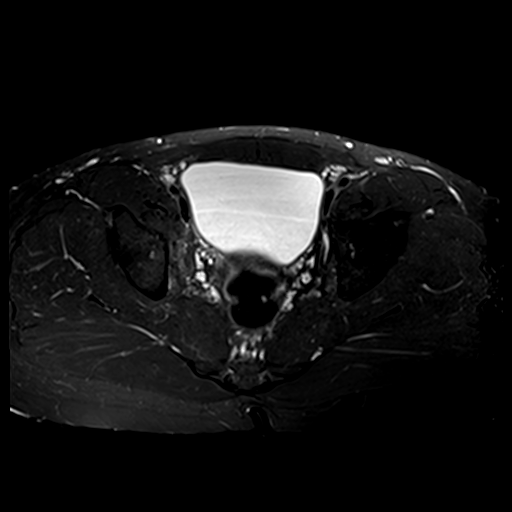
[im 25/25]
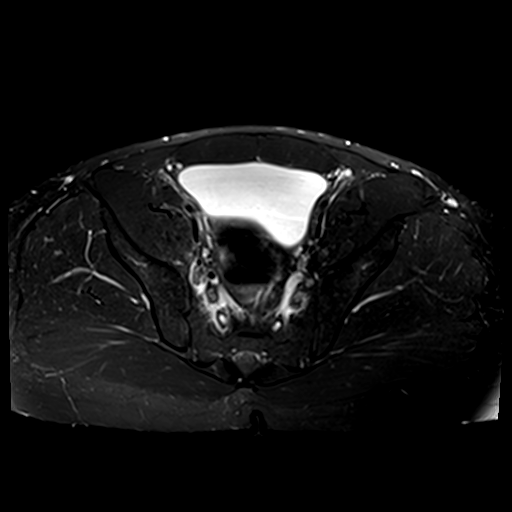

[Series 12: PD fat-sat · sagittal · right · 4.0mm · 0.70mm/px · 6 of 21 slices shown (1 of 2)]
[im 1/21]
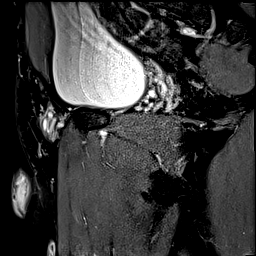
[im 5/21]
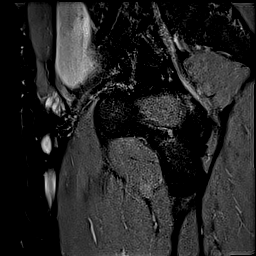
[im 9/21]
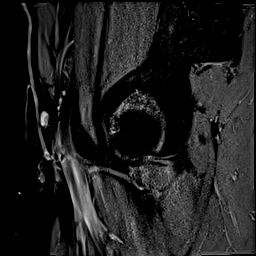
[im 13/21]
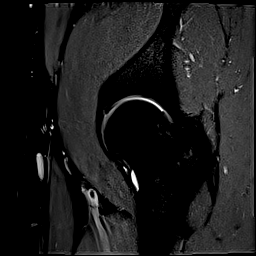
[im 17/21]
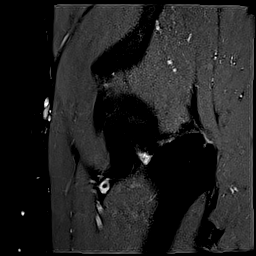
[im 21/21]
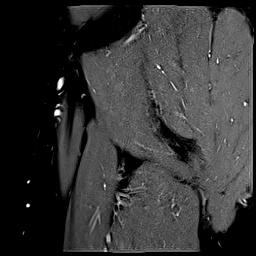

[Series 13: PD fat-sat · coronal · right · 4.0mm · 0.70mm/px · 6 of 22 slices shown (2 of 2)]
[im 1/22]
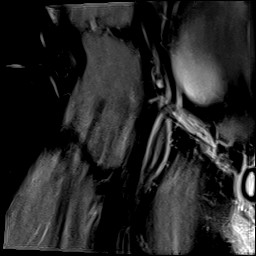
[im 5/22]
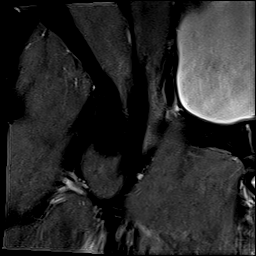
[im 9/22]
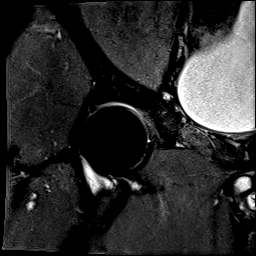
[im 13/22]
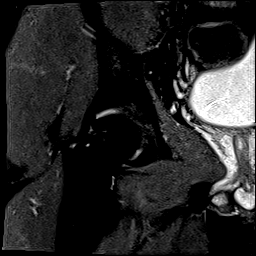
[im 17/22]
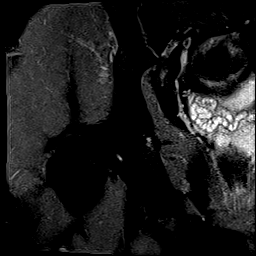
[im 22/22]
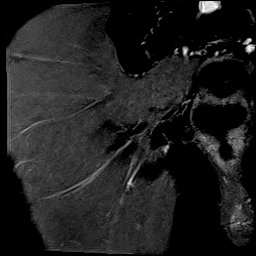

[39 of 40 positions shown; findings below may reference images not displayed]

FINDINGS: Despite efforts by the technologist and patient, mild motion
artifact is present on today's exam and could not be eliminated.
This reduces exam sensitivity and specificity.

Bones: There is no evidence of acute fracture, dislocation or
femoral head osteonecrosis. The visualized bony pelvis appears
normal. The visualized sacroiliac joints and symphysis pubis appear
normal.

Articular cartilage and labrum

Articular cartilage: No focal chondral defect or subchondral signal
abnormality identified.

Labrum: There is no gross labral tear or paralabral abnormality.

Joint or bursal effusion

Joint effusion: No significant hip joint effusion.

Bursae: No focal periarticular fluid collection.

Muscles and tendons

Muscles and tendons: The visualized gluteus, hamstring and iliopsoas
tendons appear normal. The piriformis muscles appear symmetric.

Other findings

Miscellaneous: The visualized internal pelvic contents appear
unremarkable.
IMPRESSION: Negative MRI of the right hip. No evidence of acute fracture,
dislocation or focal soft tissue abnormality.

## 2023-06-08 IMAGING — CR DG LUMBAR SPINE COMPLETE 4+V
5 series · 5 of 5 positions shown · non-contrast
Comparison: None Available.

CLINICAL DATA: Fall

EXAM:
THORACIC SPINE 2 VIEWS; LUMBAR SPINE - COMPLETE 4 VIEW

[l-spine ap]
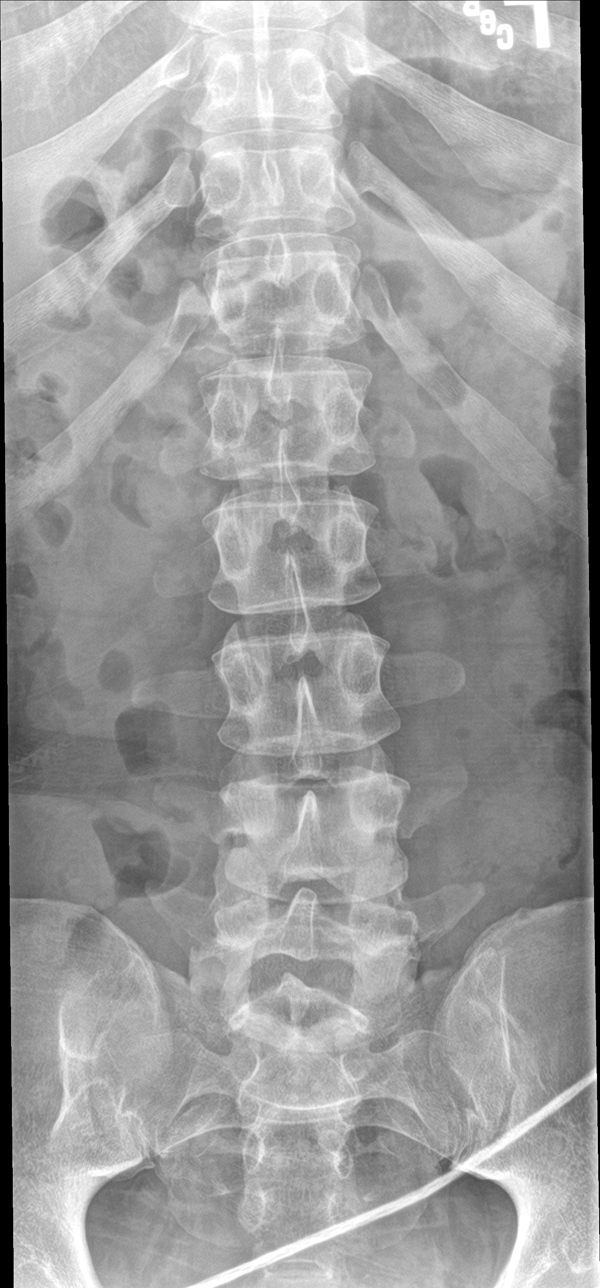

[l-spine obl (1 of 2)]
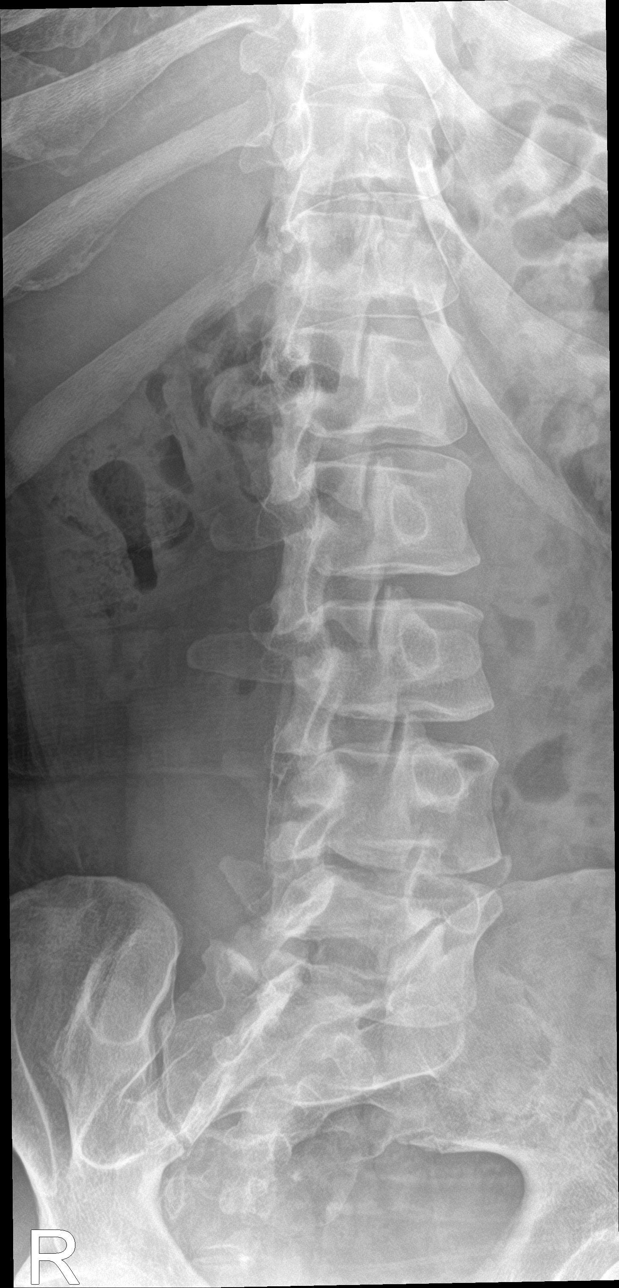

[l-spine lat]
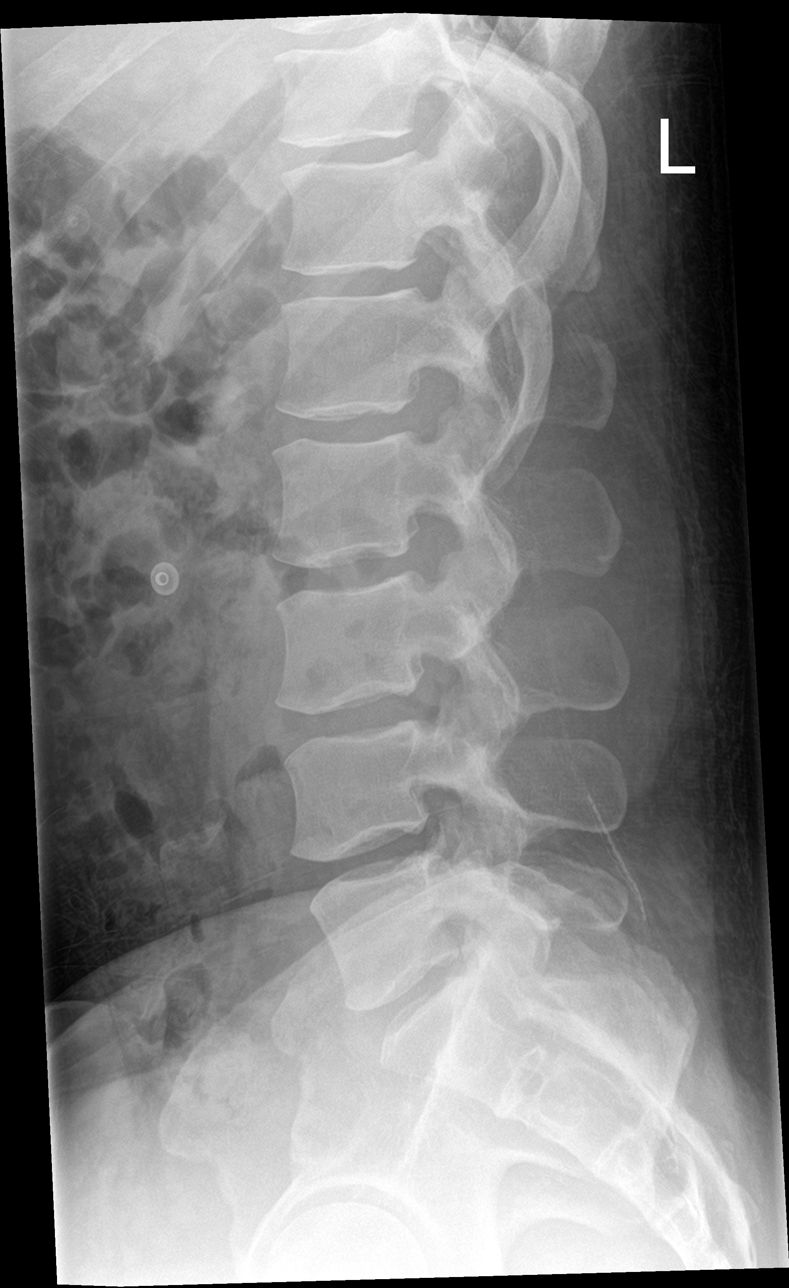

[l-spine spot]
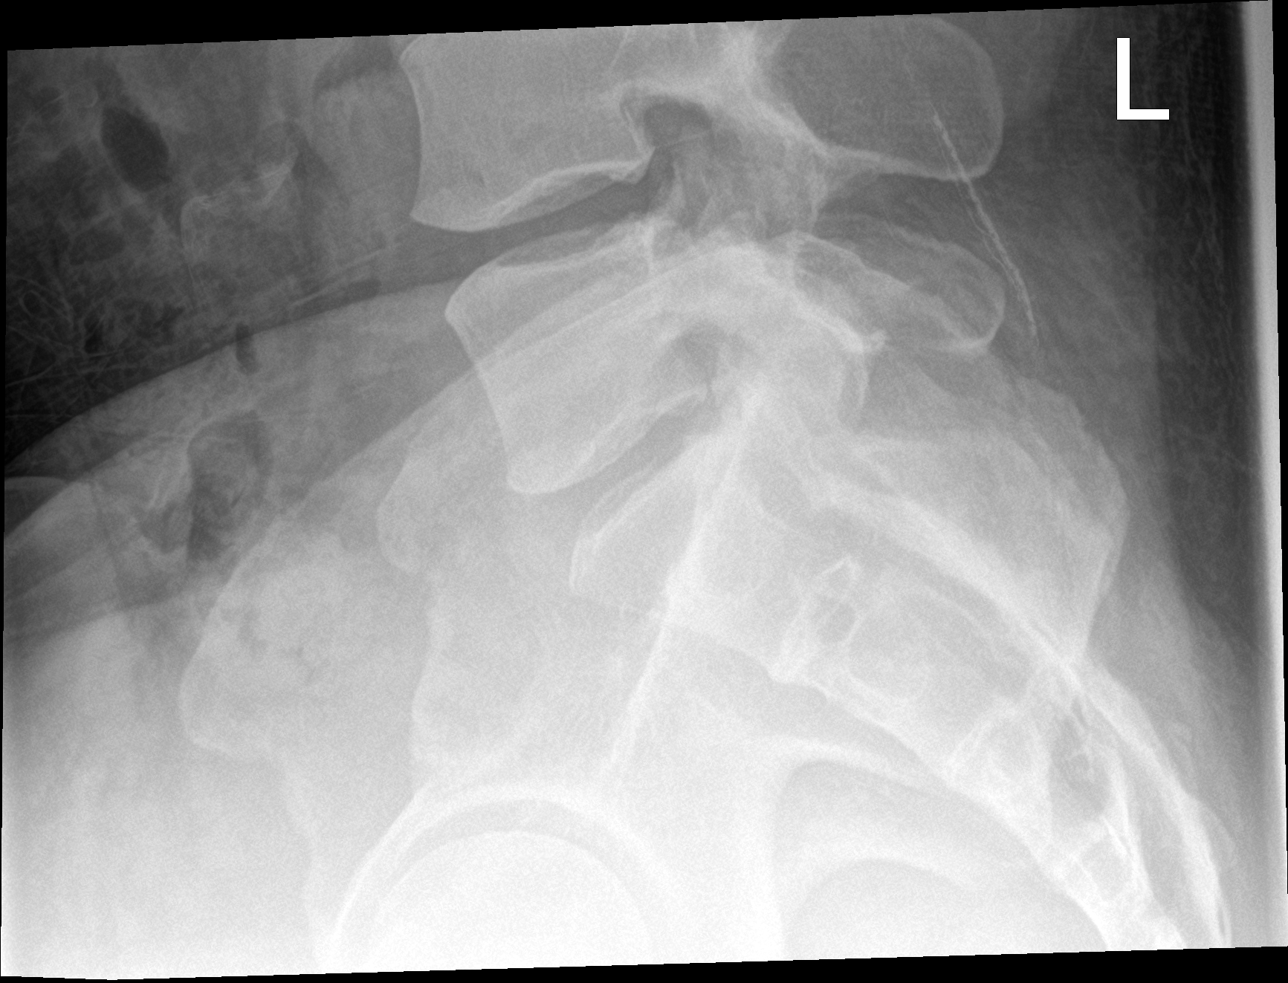

[l-spine obl (2 of 2)]
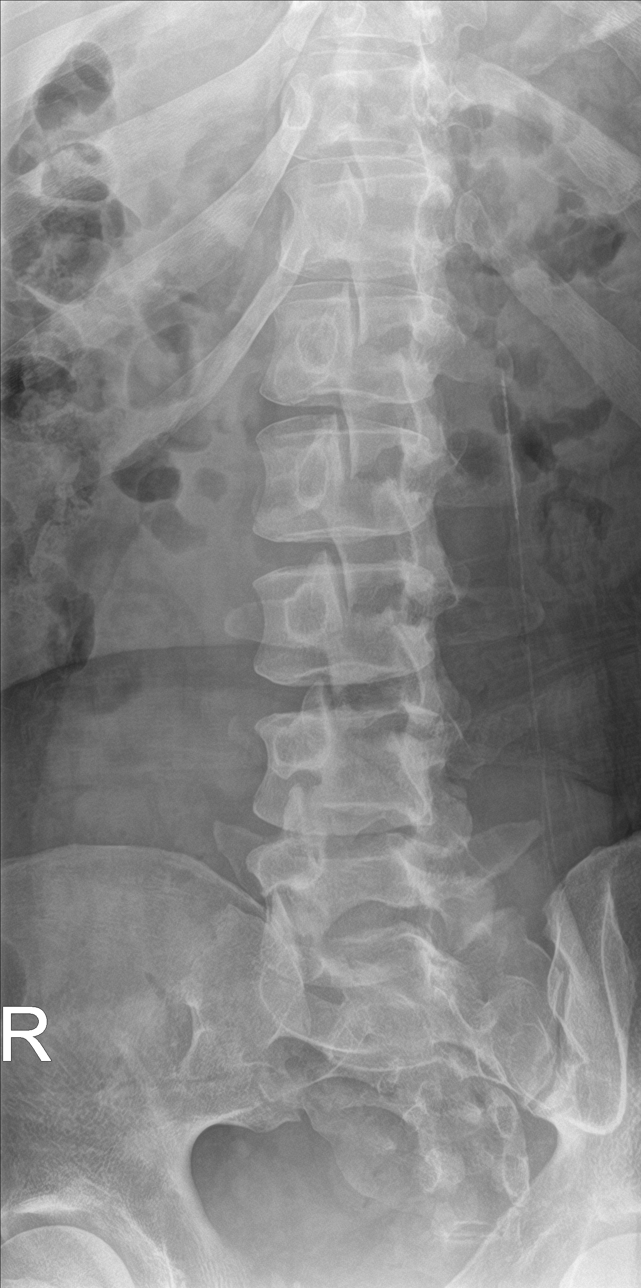

[5 of 5 positions shown; findings below may reference images not displayed]

FINDINGS: There is no evidence of thoracic or lumbar spine fracture. Alignment
is normal. No other significant bone abnormalities are identified.
IMPRESSION: No acute osseous abnormality.

## 2023-06-08 IMAGING — CT CT PELVIS W/O CM
2 of 3 series · 16 of 46 positions shown, 18 images · non-contrast
Comparison: 08/11/2021

CLINICAL DATA: Seizure

EXAM:
CT PELVIS WITHOUT CONTRAST
TECHNIQUE: Multidetector CT imaging of the pelvis was performed following the
standard protocol without intravenous contrast.
RADIATION DOSE REDUCTION: This exam was performed according to the
departmental dose-optimization program which includes automated
exposure control, adjustment of the mA and/or kV according to
patient size and/or use of iterative reconstruction technique.

[Series 4: soft tissue · axial · 0.91mm/px · z∈[-1226,-956]mm · 13 of 156 slices shown, 15 images]
[im 11/156  soft-tissue]
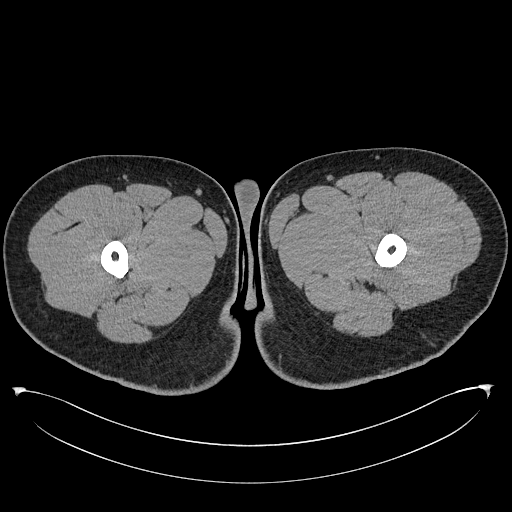
[im 11/156  bone]
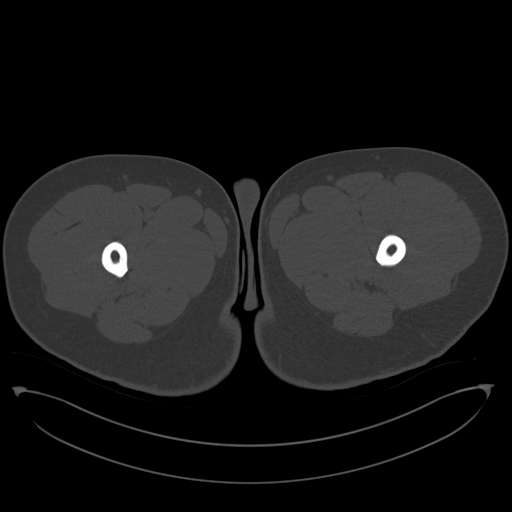
[im 21/156  soft-tissue]
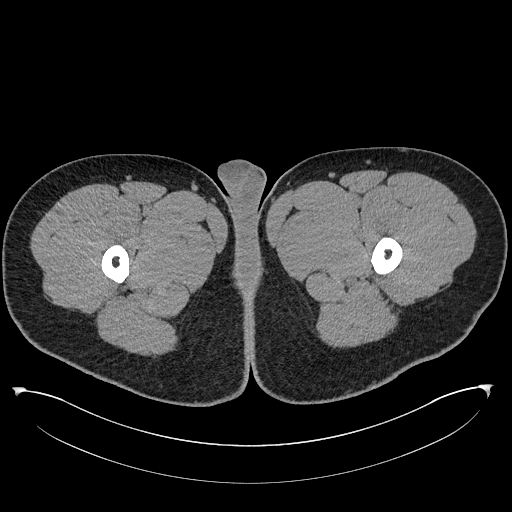
[im 31/156  soft-tissue]
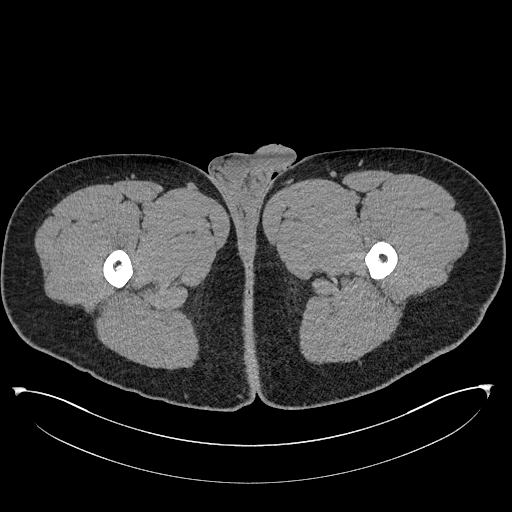
[im 46/156  soft-tissue]
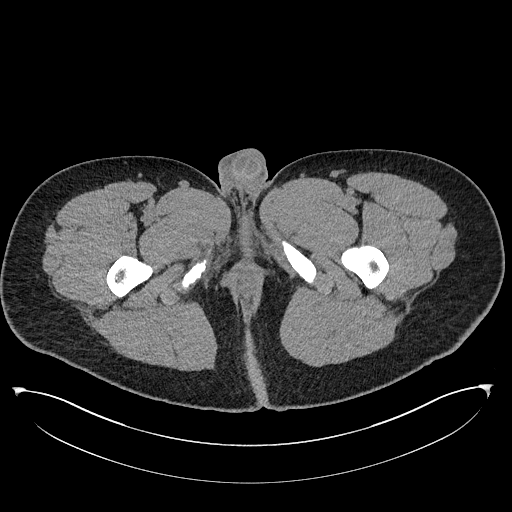
[im 56/156  soft-tissue]
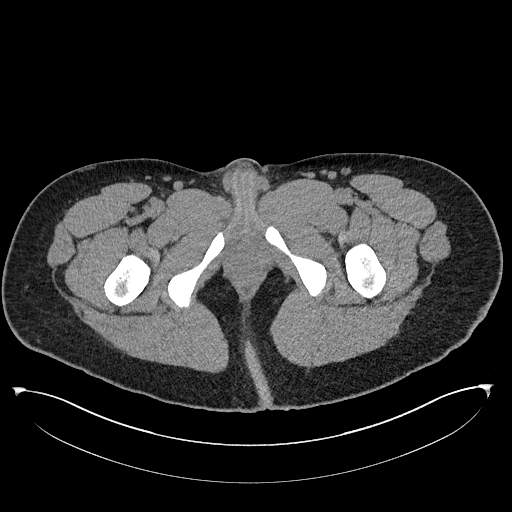
[im 66/156  soft-tissue]
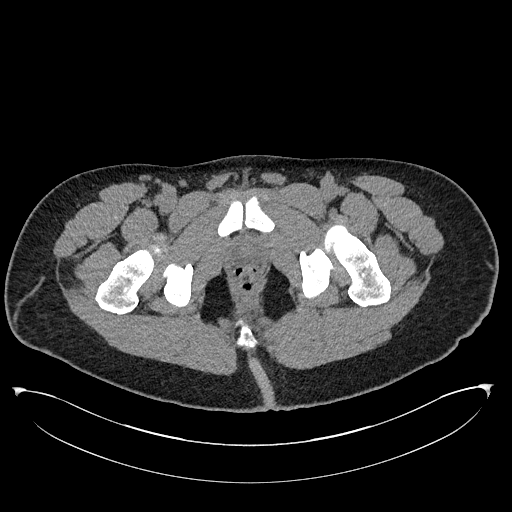
[im 81/156  soft-tissue]
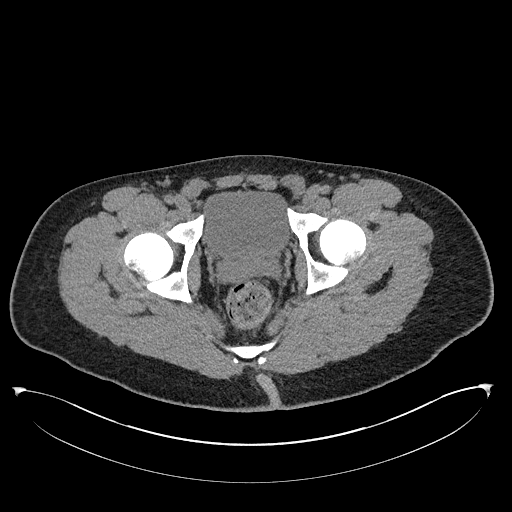
[im 91/156  soft-tissue]
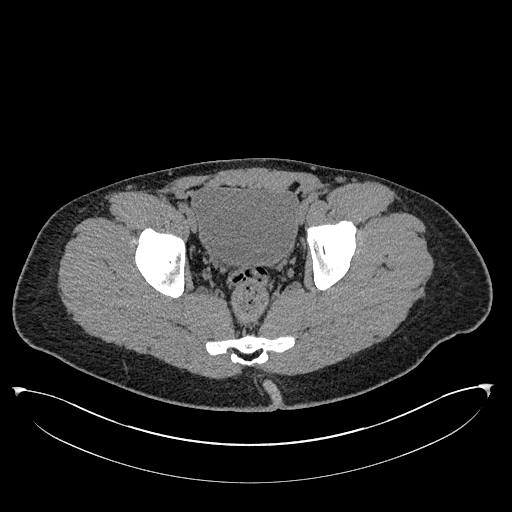
[im 101/156  soft-tissue]
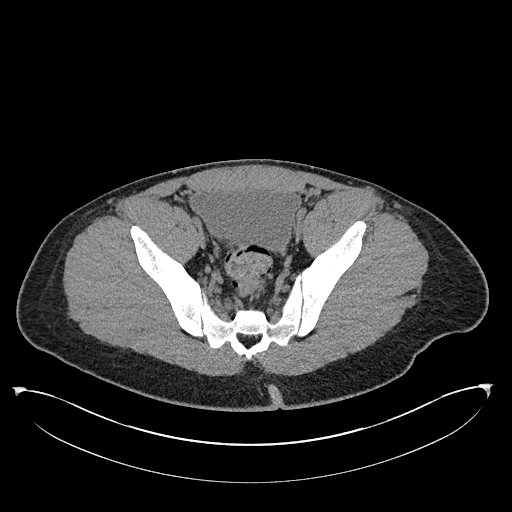
[im 101/156  bone]
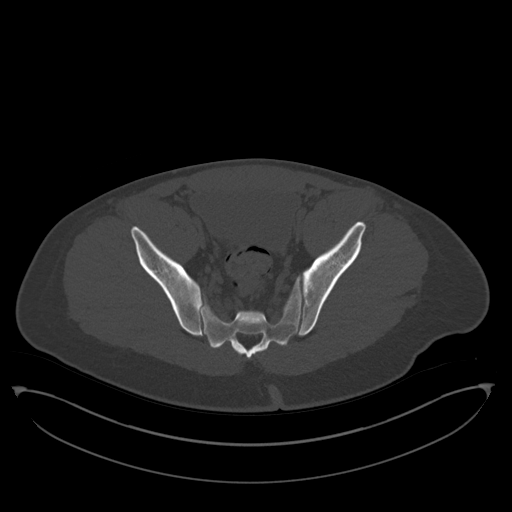
[im 111/156  soft-tissue]
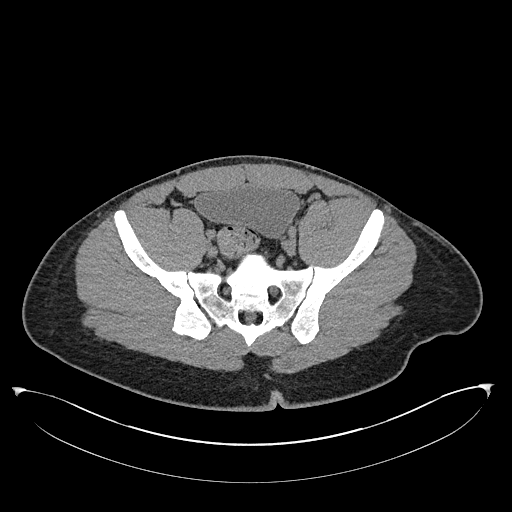
[im 126/156  soft-tissue]
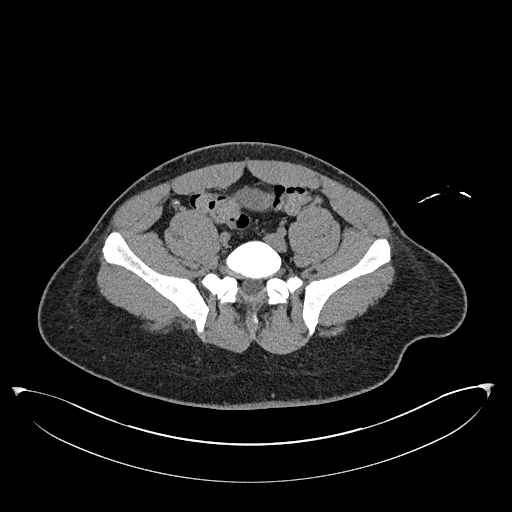
[im 136/156  soft-tissue]
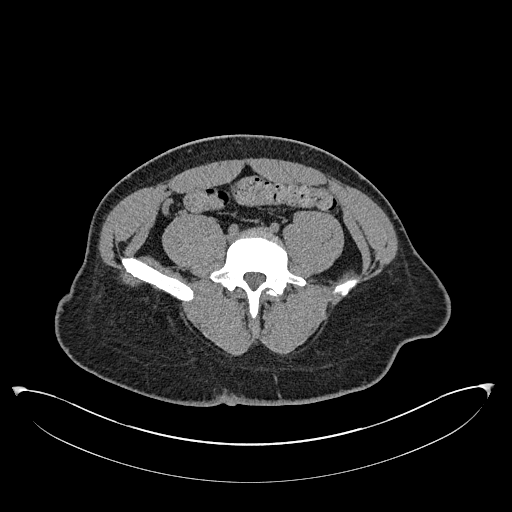
[im 146/156  soft-tissue]
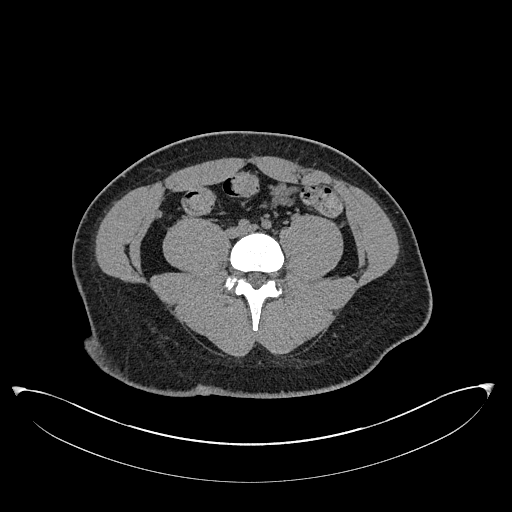

[Series 5: cor soft · coronal · 0.62mm/px · 3 of 133 slices shown]
[im 45/133  soft-tissue]
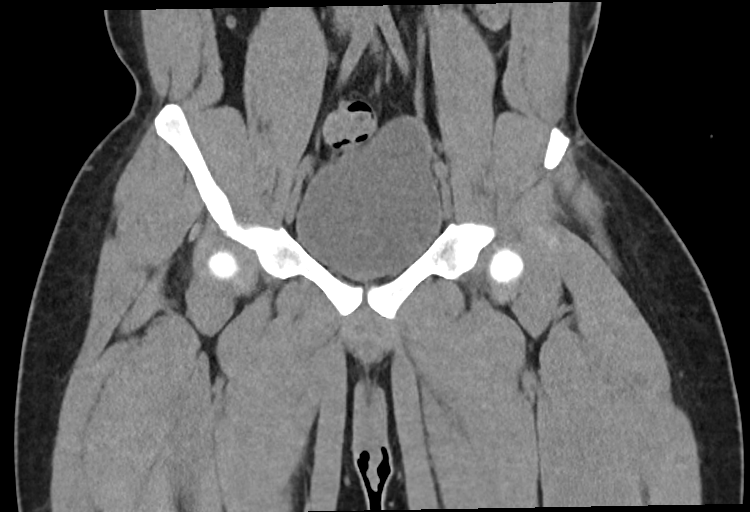
[im 59/133  soft-tissue]
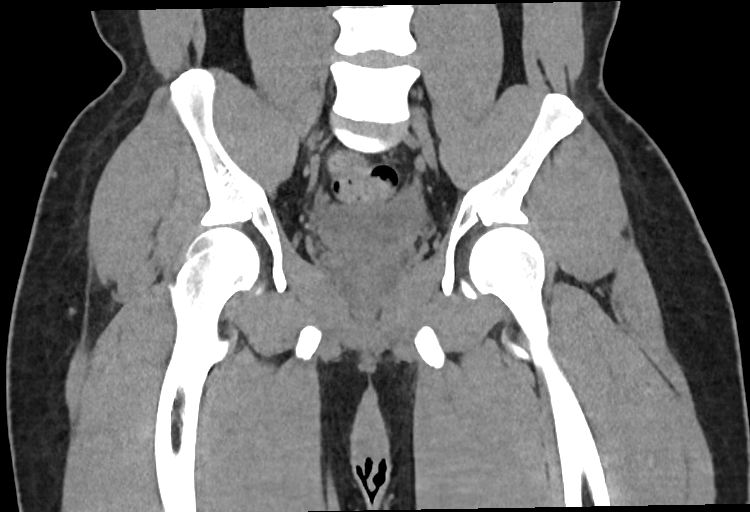
[im 74/133  soft-tissue]
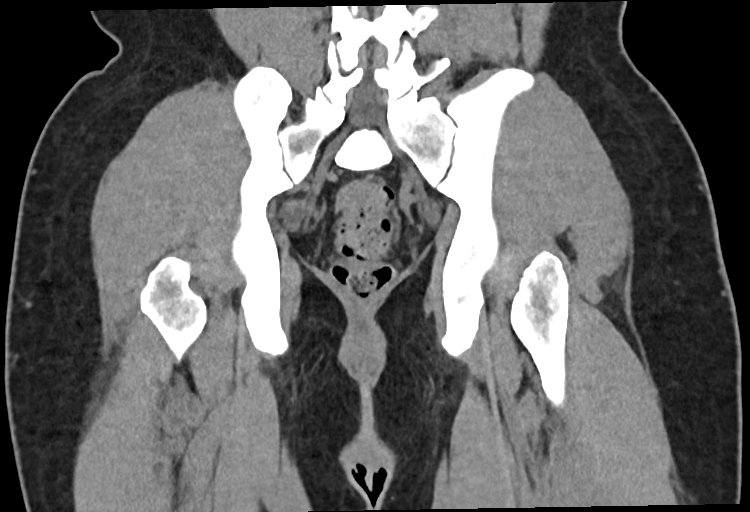

[16 of 46 positions shown; findings below may reference images not displayed]

FINDINGS: Urinary Tract:  No abnormality visualized.

Bowel:  Unremarkable visualized pelvic bowel loops.

Vascular/Lymphatic: No pathologically enlarged lymph nodes. No
significant vascular abnormality seen.

Reproductive:  No mass or other significant abnormality

Other:  Negative for pelvic effusion.

Musculoskeletal: No acute fracture or malalignment. Chronic
appearing right iliac bone deformity.
IMPRESSION: No acute osseous abnormality. No CT evidence for acute intrapelvic
abnormality.

## 2023-07-07 NOTE — Progress Notes (Signed)
 Remote pacemaker transmission.

## 2023-08-08 ENCOUNTER — Telehealth: Payer: Self-pay

## 2023-08-08 NOTE — Telephone Encounter (Signed)
 Alert remote trans6 @ 13:68mission:  HVR detected Event occurred 6/6 @ 13:42, HR 201.  EGM c/w CAP, likely PVC's, ? retrograde VT vs atrial driven - route to triage for review Presenting rhythm AP/VP Follow up as scheduled. LA, CVRS  VT with retrograde conduction of unknown duration due to falling below detection at the end. Recorded 7 minutes 39 seconds. Ventricular sensing test likely VT as well. Reviewed w/ GT MD. He would like to see in clinic to talk about treatment options. I called patients mother and LMTCB.

## 2023-08-08 NOTE — Telephone Encounter (Signed)
 Spoke with Pts mother. Pt was at a prom event for his program at time of event. She reports an episode he had about a week ago when he c/o GI-like symptoms and is diaphoretic. These are the same symptoms he would have prior to having a device and he would ultimately pass out. Noting for MD review. Sending to EP scheduler to schedule with Carolynne Citron MD only.

## 2023-08-09 NOTE — Telephone Encounter (Signed)
 Spoke w/ patients mother, Glenora Laos - patient is scheduled to see Dr. Carolynne Citron on 6/18.

## 2023-08-17 ENCOUNTER — Ambulatory Visit: Attending: Internal Medicine | Admitting: Internal Medicine

## 2023-08-17 VITALS — BP 86/50 | HR 67 | Ht 62.0 in | Wt 197.0 lb

## 2023-08-17 DIAGNOSIS — I441 Atrioventricular block, second degree: Secondary | ICD-10-CM | POA: Insufficient documentation

## 2023-08-17 DIAGNOSIS — Z79899 Other long term (current) drug therapy: Secondary | ICD-10-CM | POA: Insufficient documentation

## 2023-08-17 DIAGNOSIS — E6609 Other obesity due to excess calories: Secondary | ICD-10-CM

## 2023-08-17 DIAGNOSIS — I059 Rheumatic mitral valve disease, unspecified: Secondary | ICD-10-CM | POA: Diagnosis present

## 2023-08-17 DIAGNOSIS — Q382 Macroglossia: Secondary | ICD-10-CM | POA: Diagnosis present

## 2023-08-17 DIAGNOSIS — Q909 Down syndrome, unspecified: Secondary | ICD-10-CM | POA: Insufficient documentation

## 2023-08-17 DIAGNOSIS — E66811 Obesity, class 1: Secondary | ICD-10-CM | POA: Diagnosis present

## 2023-08-17 DIAGNOSIS — G4733 Obstructive sleep apnea (adult) (pediatric): Secondary | ICD-10-CM | POA: Insufficient documentation

## 2023-08-17 DIAGNOSIS — I440 Atrioventricular block, first degree: Secondary | ICD-10-CM | POA: Diagnosis present

## 2023-08-17 DIAGNOSIS — R55 Syncope and collapse: Secondary | ICD-10-CM

## 2023-08-17 DIAGNOSIS — R0683 Snoring: Secondary | ICD-10-CM

## 2023-08-17 DIAGNOSIS — I472 Ventricular tachycardia, unspecified: Secondary | ICD-10-CM | POA: Diagnosis present

## 2023-08-17 DIAGNOSIS — I071 Rheumatic tricuspid insufficiency: Secondary | ICD-10-CM | POA: Diagnosis present

## 2023-08-17 DIAGNOSIS — Z8774 Personal history of (corrected) congenital malformations of heart and circulatory system: Secondary | ICD-10-CM | POA: Diagnosis present

## 2023-08-17 DIAGNOSIS — Z87898 Personal history of other specified conditions: Secondary | ICD-10-CM | POA: Diagnosis present

## 2023-08-17 DIAGNOSIS — Z6834 Body mass index (BMI) 34.0-34.9, adult: Secondary | ICD-10-CM | POA: Diagnosis present

## 2023-08-17 DIAGNOSIS — I340 Nonrheumatic mitral (valve) insufficiency: Secondary | ICD-10-CM | POA: Diagnosis present

## 2023-08-17 MED ORDER — AMIODARONE HCL 200 MG PO TABS: 200.0000 mg | ORAL_TABLET | Freq: Two times a day (BID) | ORAL | 3 refills | Status: AC

## 2023-08-17 NOTE — Progress Notes (Signed)
 HPI [name] returns today for ongoing evaluation of bradycardia and syncope. 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 He has had problems with recurrent syncope. He had an ILR placed and has had nocturnal high grade heart block. He has not passed out since his device was placed. However, he has had recurrent VT at rates of 175. He has not had frank syncope but has been noted to have episodes of diaphoresis. He is unable to speak so we cannot get any symptoms. In addition he has had problems with hypotension.  No Known Allergies   Current Outpatient Medications  Medication Sig Dispense Refill   albuterol  (PROVENTIL ) (2.5 MG/3ML) 0.083% nebulizer solution Take 3 mLs (2.5 mg total) by nebulization every 6 (six) hours as needed for wheezing or shortness of breath. 75 mL 12   Guaifenesin  1200 MG TB12 Take 1 tablet (1,200 mg total) by mouth in the morning and at bedtime. 14 tablet 0   ondansetron  (ZOFRAN -ODT) 4 MG disintegrating tablet Take 1 tablet (4 mg total) by mouth every 8 (eight) hours as needed for nausea or vomiting. 20 tablet 0   promethazine -dextromethorphan (PROMETHAZINE -DM) 6.25-15 MG/5ML syrup Take 5 mLs by mouth at bedtime as needed for cough. 118 mL 0   oseltamivir  (TAMIFLU ) 75 MG capsule Take 1 capsule (75 mg total) by mouth every 12 (twelve) hours. 10 capsule 0   No current facility-administered medications for this visit.     Past Medical History:  Diagnosis Date   Down's syndrome    Seizures (HCC)     ROS:   All systems reviewed and negative except as noted in the HPI.   Past Surgical History:  Procedure Laterality Date   BACK SURGERY     LOOP RECORDER REMOVAL N/A 08/25/2022   Procedure: LOOP RECORDER REMOVAL;  Surgeon: Tammie Fall, MD;  Location: [facility];  Service: Cardiovascular;  Laterality: N/A;   PACEMAKER IMPLANT N/A 08/25/2022   Procedure: PACEMAKER IMPLANT;  Surgeon: Tammie Fall, MD;   Location: [facility];  Service: Cardiovascular;  Laterality: N/A;   REPAIR OF LEFT VENTRICLE LACERATION       Family History  Problem Relation Age of Onset   Diabetes Father    Bell's palsy Father    Seizures Neg Hx      Social History   Socioeconomic History   Marital status: Single    Spouse name: Not on file   Number of children: 0   Years of education: HS   Highest education level: Not on file  Occupational History   Occupation: N/A  Tobacco Use   Smoking status: Never   Smokeless tobacco: Never  Vaping Use   Vaping status: Never Used  Substance and Sexual Activity   Alcohol use: No   Drug use: No   Sexual activity: Not on file  Other Topics Concern   Not on file  Social History Narrative   Patient occasionally drinks soft drinks.   Patient is right handed but does other tasks with his left hand.          Social Drivers of Corporate investment banker Strain: Not on file  Food Insecurity: No Food Insecurity (03/24/2022)   Hunger Vital Sign    Worried About Running Out of Food in the Last Year: Never true    Ran Out of Food in the Last Year: Never true  Transportation  Needs: Unmet Transportation Needs (03/24/2022)   PRAPARE - Administrator, Civil Service (Medical): No    Lack of Transportation (Non-Medical): Yes  Physical Activity: Not on file  Stress: Not on file  Social Connections: Not on file  Intimate Partner Violence: Not on file     BP (!) 86/50   Pulse 67   Ht 5' 2 (1.575 m)   Wt 197 lb (89.4 kg)   SpO2 98%   BMI 36.03 kg/m   Physical Exam:  Well appearing NAD HEENT: Unremarkable Neck:  No JVD, no thyromegally Lymphatics:  No adenopathy Back:  No CVA tenderness Lungs:  Clear with no wheezes HEART:  Regular rate rhythm, no murmurs, no rubs, no clicks Abd:  soft, positive bowel sounds, no organomegally, no rebound, no guarding Ext:  2 plus pulses, no edema, no cyanosis, no clubbing Skin:  No rashes no  nodules Neuro:  CN II through XII intact, motor grossly intact  DEVICE  Normal device function.  See PaceArt for details.   Assess/Plan: VT - with his prior VSD, and despite preserved LV function, I am concerned about degeneration. I discussed this with his sister. I offered ICD insertion but she is against this. A beta blocker might prevent the VT but would lower his bp which runs in the 80-90 range at baseline. I have recommended a trial of low dose amio and we will start him on 200 mg daily. We will check baseline labs and a CXR. Heart block - he is asymptomatic s/p PPM insertion.  VSD - he is s/p repair. Down syndrome - he has not had any changes.   [name],MD

## 2023-08-17 NOTE — Patient Instructions (Addendum)
 Medication Instructions:  Your physician has recommended you make the following change in your medication:  Start Amiodarone 200mg  twice daily.  Lab Work: CMP, TSH, Free 4T--Today  You may go to any [facility] Location for your lab work:  [facility] - [address] ([facility]) - [address] [phone] [address]  [facility] - [address]   [place]  - [address]   [place] - [address] - [address] [employer]  - [address] - [address] ([facility]   If you have labs (blood work) drawn today and your tests are completely normal, you will receive your results only by: [employer] (if you have MyChart)  If you have any lab test that is abnormal or we need to change your treatment, we will call you or send a MyChart message to review the results.  Testing/Procedures: Chest X-Ray  Follow-Up: At [facility], you and your health needs are our priority.  As part of our continuing mission to provide you with exceptional heart care, we have created designated Provider Care Teams.  These Care Teams include your primary Cardiologist (physician) and Advanced Practice Providers (APPs -  Physician Assistants and Nurse Practitioners) who all work together to provide you with the care you need, when you need it.  Your next appointment:   3 months  The format for your next appointment:   In Person  Provider:   Manya Sells, MD{or one of the following Advanced Practice Providers on your designated Care Team:   Mertha Abrahams, [place] Merla Starch, [place] Neda Balk, NP  Note: Remote monitoring is used to monitor your Pacemaker/ ICD from home. This monitoring reduces the number of office visits required to check your device to one time per year. It allows us  to keep an eye on the functioning of your device to ensure it is working properly.

## 2023-08-23 ENCOUNTER — Ambulatory Visit (INDEPENDENT_AMBULATORY_CARE_PROVIDER_SITE_OTHER): Payer: Medicare Other

## 2023-08-23 DIAGNOSIS — R55 Syncope and collapse: Secondary | ICD-10-CM

## 2023-08-23 LAB — CUP PACEART REMOTE DEVICE CHECK
Battery Remaining Longevity: 89 mo
Battery Remaining Percentage: 89 %
Battery Voltage: 2.98 V
Brady Statistic AP VP Percent: 33 %
Brady Statistic AP VS Percent: 1 %
Brady Statistic AS VP Percent: 67 %
Brady Statistic AS VS Percent: 1 %
Brady Statistic RA Percent Paced: 32 %
Brady Statistic RV Percent Paced: 99 %
Date Time Interrogation Session: 20250624030029
Implantable Lead Connection Status: 753985
Implantable Lead Connection Status: 753985
Implantable Lead Implant Date: 20240626
Implantable Lead Implant Date: 20240626
Implantable Lead Location: 753859
Implantable Lead Location: 753860
Implantable Pulse Generator Implant Date: 20240626
Lead Channel Impedance Value: 450 Ohm
Lead Channel Impedance Value: 490 Ohm
Lead Channel Pacing Threshold Amplitude: 0.75 V
Lead Channel Pacing Threshold Amplitude: 0.75 V
Lead Channel Pacing Threshold Pulse Width: 0.5 ms
Lead Channel Pacing Threshold Pulse Width: 0.5 ms
Lead Channel Sensing Intrinsic Amplitude: 2.7 mV
Lead Channel Sensing Intrinsic Amplitude: 9.3 mV
Lead Channel Setting Pacing Amplitude: 2 V
Lead Channel Setting Pacing Amplitude: 2.5 V
Lead Channel Setting Pacing Pulse Width: 0.5 ms
Lead Channel Setting Sensing Sensitivity: 2 mV
Pulse Gen Model: 2272
Pulse Gen Serial Number: 8185838

## 2023-08-28 ENCOUNTER — Ambulatory Visit: Payer: Self-pay | Admitting: Internal Medicine

## 2023-11-04 ENCOUNTER — Other Ambulatory Visit: Payer: Self-pay

## 2023-11-04 ENCOUNTER — Emergency Department (HOSPITAL_COMMUNITY)

## 2023-11-04 ENCOUNTER — Encounter (HOSPITAL_COMMUNITY): Payer: Self-pay | Admitting: Emergency Medicine

## 2023-11-04 ENCOUNTER — Emergency Department (HOSPITAL_COMMUNITY)
Admission: EM | Admit: 2023-11-04 | Discharge: 2023-11-04 | Disposition: A | Discharge status: Home / Self Care | Attending: Emergency Medicine | Admitting: Emergency Medicine

## 2023-11-04 DIAGNOSIS — R55 Syncope and collapse: Secondary | ICD-10-CM | POA: Insufficient documentation

## 2023-11-04 LAB — BASIC METABOLIC PANEL WITH GFR
Anion gap: 10 (ref 5–15)
BUN: 18 mg/dL (ref 6–20)
CO2: 23 mmol/L (ref 22–32)
Calcium: 8.2 mg/dL — ABNORMAL LOW (ref 8.9–10.3)
Chloride: 107 mmol/L (ref 98–111)
Creatinine, Ser: 1.24 mg/dL (ref 0.61–1.24)
GFR, Estimated: >60 mL/min (ref >60)
Glucose, Bld: 94 mg/dL (ref 70–99)
Potassium: 4.7 mmol/L (ref 3.5–5.1)
Sodium: 140 mmol/L (ref 135–145)

## 2023-11-04 LAB — CBG MONITORING, ED: Glucose-Capillary: 96 mg/dL (ref 70–99)

## 2023-11-04 LAB — CBC
HCT: 45.3 % (ref 39.0–52.0)
Hemoglobin: 15.2 g/dL (ref 13.0–17.0)
MCH: 33.3 pg (ref 26.0–34.0)
MCHC: 33.6 g/dL (ref 30.0–36.0)
MCV: 99.3 fL (ref 80.0–100.0)
Platelets: 148 K/uL — ABNORMAL LOW (ref 150–400)
RBC: 4.56 MIL/uL (ref 4.22–5.81)
RDW: 13.4 % (ref 11.5–15.5)
WBC: 4.5 K/uL (ref 4.0–10.5)
nRBC: 0 % (ref 0.0–0.2)

## 2023-11-04 MED ORDER — LACTATED RINGERS IV BOLUS
500.0000 mL | Freq: Once | INTRAVENOUS | Status: AC
  Administered 2023-11-04: 500 mL via INTRAVENOUS

## 2023-11-04 NOTE — ED Provider Notes (Signed)
 [place] [facility] DE[facility]MENT AT [facility] Provider Note   CSN: [id number] Arrival date & time: 11/04/23  1952     Patient presents with: Near Syncope   Jeffrey Jones is a 33 y.o. male.    Near Syncope     Patient has a history of Down syndrome seizures.  Per EMS report patient was outside playing soccer.  He ended up stopping and staring off and the middle of the field.  Patient then bent over.  Family felt patient has been more lethargic than baseline since the event.  Prior to Admission medications   Medication Sig Start Date End Date Taking? Authorizing Provider  albuterol  (PROVENTIL ) (2.5 MG/3ML) 0.083% nebulizer solution Take 3 mLs (2.5 mg total) by nebulization every 6 (six) hours as needed for wheezing or shortness of breath. 04/07/23   Enedelia Dorna HERO, FNP  amiodarone  (PACERONE ) 200 MG tablet Take 1 tablet (200 mg total) by mouth 2 (two) times daily. 08/17/23   Waddell Danelle ORN, MD  Guaifenesin  1200 MG TB12 Take 1 tablet (1,200 mg total) by mouth in the morning and at bedtime. 04/07/23   Enedelia Dorna HERO, FNP  ondansetron  (ZOFRAN -ODT) 4 MG disintegrating tablet Take 1 tablet (4 mg total) by mouth every 8 (eight) hours as needed for nausea or vomiting. 04/07/23   Enedelia Dorna HERO, FNP  oseltamivir  (TAMIFLU ) 75 MG capsule Take 1 capsule (75 mg total) by mouth every 12 (twelve) hours. 04/07/23   Enedelia Dorna HERO, FNP  promethazine -dextromethorphan (PROMETHAZINE -DM) 6.25-15 MG/5ML syrup Take 5 mLs by mouth at bedtime as needed for cough. 04/07/23   Enedelia Dorna HERO, FNP    Allergies: Patient has no known allergies.    Review of Systems  Cardiovascular:  Positive for near-syncope.    Updated Vital Signs BP 107/66   Pulse 67   Temp 98.1 F (36.7 C) (Oral)   Resp 18   Ht 1.575 m (5' 2)   Wt 89.4 kg   SpO2 97%   BMI 36.05 kg/m   Physical Exam Vitals and nursing note reviewed.  Constitutional:      Appearance: He is well-developed. He  is not diaphoretic.  HENT:     Head: Normocephalic and atraumatic.     Comments: macroglossia    Right Ear: External ear normal.     Left Ear: External ear normal.  Eyes:     General: No scleral icterus.       Right eye: No discharge.        Left eye: No discharge.     Conjunctiva/sclera: Conjunctivae normal.  Neck:     Trachea: No tracheal deviation.  Cardiovascular:     Rate and Rhythm: Normal rate and regular rhythm.  Pulmonary:     Effort: Pulmonary effort is normal. No respiratory distress.     Breath sounds: Normal breath sounds. No stridor. No wheezing or rales.  Abdominal:     General: Bowel sounds are normal. There is no distension.     Palpations: Abdomen is soft.     Tenderness: There is no abdominal tenderness. There is no guarding or rebound.  Musculoskeletal:        General: No tenderness or deformity.     Cervical back: Neck supple.  Skin:    General: Skin is warm and dry.     Findings: No rash.  Neurological:     General: No focal deficit present.     Mental Status: He is alert.     Cranial  Nerves: No cranial nerve deficit, dysarthria or facial asymmetry.     Sensory: No sensory deficit.     Motor: No abnormal muscle tone or seizure activity.     Coordination: Coordination normal.     Comments: Difficulty lifting right leg, able to lift left leg both arms without difficulty,  Psychiatric:        Mood and Affect: Mood normal.     (all labs ordered are listed, but only abnormal results are displayed) Labs Reviewed  CBC - Abnormal; Notable for the following components:      Result Value   Platelets 148 (*)    All other components within normal limits  BASIC METABOLIC PANEL WITH GFR - Abnormal; Notable for the following components:   Calcium 8.2 (*)    All other components within normal limits  CBG MONITORING, ED    EKG: EKG Interpretation Date/Time:  Friday November 04 2023 20:34:05 EDT Ventricular Rate:  70 PR Interval:  228 QRS  Duration:  110 QT Interval:  396 QTC Calculation: 427 R Axis:   -52  Text Interpretation: Atrial-sensed ventricular-paced rhythm with prolonged AV conduction Abnormal ECG When compared with ECG of 05-May-2023 15:23, No significant change since last tracing Confirmed by Randol Simmonds [phone]) on 11/04/2023 8:36:05 PM  Radiology: CT Head Wo Contrast Result Date: 11/04/2023 CLINICAL DATA:  Mental status change, unknown cause EXAM: CT HEAD WITHOUT CONTRAST TECHNIQUE: Contiguous axial images were obtained from the base of the skull through the vertex without intravenous contrast. RADIATION DOSE REDUCTION: This exam was performed according to the departmental dose-optimization program which includes automated exposure control, adjustment of the mA and/or kV according to patient size and/or use of iterative reconstruction technique. COMPARISON:  08/11/2021 FINDINGS: Brain: No acute intracranial abnormality. Specifically, no hemorrhage, hydrocephalus, mass lesion, acute infarction, or significant intracranial injury. Vascular: No hyperdense vessel or unexpected calcification. Skull: No acute calvarial abnormality. Sinuses/Orbits: No acute findings Other: None IMPRESSION: No acute intracranial abnormality. Electronically Signed   By: Franky Crease M.D.   On: 11/04/2023 21:09     Procedures   Medications Ordered in the ED  lactated ringers  bolus 500 mL (500 mLs Intravenous New Bag/Given 11/04/23 2113)    Clinical Course as of 11/04/23 2223  Fri Nov 04, 2023  2151 CBC(!) CBC normal.  Metabolic panel normal. [JK]  2151 Head CT without acute abnormality [JK]    Clinical Course User Index [JK] Randol Simmonds, MD                                 Medical Decision Making Problems Addressed: Near syncope: acute illness or injury that poses a threat to life or bodily functions  Amount and/or Complexity of Data Reviewed Labs: ordered. Decision-making details documented in ED Course. Radiology: ordered and  independent interpretation performed.   Patient presented to the ED for evaluation after a near syncopal type event.  Patient was outside when he had to stop and bend over.  There was no loss of consciousness.  Patient is at his baseline now.  No signs of anemia no electrolyte abnormalities.  No dysrhythmia noted on EKG cardiac monitor.  Patient has been followed by cardiology in the past.  He does have history of ventricular tachycardia as well as bradycardia.  Patient does have a pacemaker in place.  He currently does not have an AICD.  At this time he appears stable discharge with outpatient follow-up with  his cardiologist.     Final diagnoses:  Near syncope    ED Discharge Orders     None          Randol Simmonds, MD 11/04/23 2225

## 2023-11-04 NOTE — ED Triage Notes (Signed)
 BIB [facility], pt was playing soccer and reported to stop staring off in to the middle of the soccer field. Pt then bent over. Family reports this is not pt norm. Pt seems to be more lethargic than baseline. Pt reported to have on demand pacemaker. EMS reports that pt had soft BP with them.   20 g lt hand BP 112/60 - 86/40 HR - 80s Spo2 - 98% RA CBG - 79

## 2023-11-22 ENCOUNTER — Ambulatory Visit (INDEPENDENT_AMBULATORY_CARE_PROVIDER_SITE_OTHER): Payer: Medicare Other

## 2023-11-22 DIAGNOSIS — R55 Syncope and collapse: Secondary | ICD-10-CM | POA: Diagnosis not present

## 2023-11-23 LAB — CUP PACEART REMOTE DEVICE CHECK
Battery Remaining Longevity: 88 mo
Battery Remaining Percentage: 87 %
Battery Voltage: 2.98 V
Brady Statistic AP VP Percent: 34 %
Brady Statistic AP VS Percent: 1 %
Brady Statistic AS VP Percent: 66 %
Brady Statistic AS VS Percent: 1 %
Brady Statistic RA Percent Paced: 34 %
Brady Statistic RV Percent Paced: 99 %
Date Time Interrogation Session: 20250923020021
Implantable Lead Connection Status: 753985
Implantable Lead Connection Status: 753985
Implantable Lead Implant Date: 20240626
Implantable Lead Implant Date: 20240626
Implantable Lead Location: 753859
Implantable Lead Location: 753860
Implantable Pulse Generator Implant Date: 20240626
Lead Channel Impedance Value: 480 Ohm
Lead Channel Impedance Value: 530 Ohm
Lead Channel Pacing Threshold Amplitude: 0.75 V
Lead Channel Pacing Threshold Amplitude: 0.75 V
Lead Channel Pacing Threshold Pulse Width: 0.5 ms
Lead Channel Pacing Threshold Pulse Width: 0.5 ms
Lead Channel Sensing Intrinsic Amplitude: 10.8 mV
Lead Channel Sensing Intrinsic Amplitude: 3.8 mV
Lead Channel Setting Pacing Amplitude: 2 V
Lead Channel Setting Pacing Amplitude: 2.5 V
Lead Channel Setting Pacing Pulse Width: 0.5 ms
Lead Channel Setting Sensing Sensitivity: 2 mV
Pulse Gen Model: 2272
Pulse Gen Serial Number: 8185838

## 2023-11-23 NOTE — Progress Notes (Signed)
 Remote PPM Transmission

## 2023-11-27 ENCOUNTER — Ambulatory Visit: Payer: Self-pay | Admitting: Internal Medicine

## 2024-02-21 ENCOUNTER — Ambulatory Visit: Payer: Medicare Other

## 2024-02-21 DIAGNOSIS — R55 Syncope and collapse: Secondary | ICD-10-CM

## 2024-02-21 LAB — CUP PACEART REMOTE DEVICE CHECK
Battery Remaining Longevity: 85 mo
Battery Remaining Percentage: 84 %
Battery Voltage: 2.98 V
Brady Statistic AP VP Percent: 34 %
Brady Statistic AP VS Percent: 1 %
Brady Statistic AS VP Percent: 66 %
Brady Statistic AS VS Percent: 1 %
Brady Statistic RA Percent Paced: 34 %
Brady Statistic RV Percent Paced: 99 %
Date Time Interrogation Session: 20251223020016
Implantable Lead Connection Status: 753985
Implantable Lead Connection Status: 753985
Implantable Lead Implant Date: 20240626
Implantable Lead Implant Date: 20240626
Implantable Lead Location: 753859
Implantable Lead Location: 753860
Implantable Pulse Generator Implant Date: 20240626
Lead Channel Impedance Value: 460 Ohm
Lead Channel Impedance Value: 530 Ohm
Lead Channel Pacing Threshold Amplitude: 0.75 V
Lead Channel Pacing Threshold Amplitude: 0.75 V
Lead Channel Pacing Threshold Pulse Width: 0.5 ms
Lead Channel Pacing Threshold Pulse Width: 0.5 ms
Lead Channel Sensing Intrinsic Amplitude: 10.8 mV
Lead Channel Sensing Intrinsic Amplitude: 5 mV
Lead Channel Setting Pacing Amplitude: 2 V
Lead Channel Setting Pacing Amplitude: 2.5 V
Lead Channel Setting Pacing Pulse Width: 0.5 ms
Lead Channel Setting Sensing Sensitivity: 2 mV
Pulse Gen Model: 2272
Pulse Gen Serial Number: 8185838

## 2024-02-22 NOTE — Progress Notes (Signed)
 Remote PPM Transmission

## 2024-03-04 ENCOUNTER — Ambulatory Visit: Payer: Self-pay | Admitting: Cardiology

## 2024-03-05 NOTE — Telephone Encounter (Signed)
 Call x1; lvmtcb w/ mother Shalik Sanfilippo to schedule patient for overdue 3 mo f/u (was due 10/2023) + f/u on last STJ PPM transmission; Sustained MMVT.

## 2024-03-05 NOTE — Telephone Encounter (Signed)
-----   Message from Nurse Prentice ORN, RN sent at 03/05/2024  7:20 AM EST -----  ----- Message ----- From: Kennyth Chew, MD Sent: 03/04/2024   9:02 PM EST To: [name]  Patient needs EP APP follow up visit.   Jeffrey Jones

## 2024-03-15 ENCOUNTER — Encounter: Payer: Self-pay | Admitting: Emergency Medicine
